# Patient Record
Sex: Male | Born: 1950 | Race: White | Hispanic: No | Marital: Married | Smoking: Never smoker
Health system: Southern US, Community
[De-identification: ages and names within clinical notes are randomized; demographics above are authoritative.]

## PROBLEM LIST (undated history)

## (undated) DIAGNOSIS — M1990 Unspecified osteoarthritis, unspecified site: Secondary | ICD-10-CM

## (undated) DIAGNOSIS — Z9289 Personal history of other medical treatment: Secondary | ICD-10-CM

## (undated) DIAGNOSIS — Z889 Allergy status to unspecified drugs, medicaments and biological substances status: Secondary | ICD-10-CM

## (undated) DIAGNOSIS — E785 Hyperlipidemia, unspecified: Secondary | ICD-10-CM

## (undated) DIAGNOSIS — M549 Dorsalgia, unspecified: Secondary | ICD-10-CM

## (undated) DIAGNOSIS — G709 Myoneural disorder, unspecified: Secondary | ICD-10-CM

## (undated) DIAGNOSIS — G2581 Restless legs syndrome: Secondary | ICD-10-CM

## (undated) DIAGNOSIS — D229 Melanocytic nevi, unspecified: Secondary | ICD-10-CM

## (undated) DIAGNOSIS — T148XXA Other injury of unspecified body region, initial encounter: Secondary | ICD-10-CM

## (undated) DIAGNOSIS — I10 Essential (primary) hypertension: Secondary | ICD-10-CM

## (undated) HISTORY — PX: FUNCTIONAL ENDOSCOPIC SINUS SURGERY: SUR616

## (undated) HISTORY — PX: BACK SURGERY: SHX140

## (undated) HISTORY — PX: COLONOSCOPY: SHX174

---
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
## 2000-08-18 ENCOUNTER — Observation Stay (HOSPITAL_COMMUNITY): Admission: AD | Admit: 2000-08-18 | Discharge: 2000-08-19 | Payer: Self-pay | Admitting: Neurosurgery

## 2000-08-18 ENCOUNTER — Encounter: Payer: Self-pay | Admitting: Neurosurgery

## 2002-10-29 ENCOUNTER — Encounter: Payer: Self-pay | Admitting: Neurosurgery

## 2002-10-29 ENCOUNTER — Ambulatory Visit (HOSPITAL_COMMUNITY): Admission: RE | Admit: 2002-10-29 | Discharge: 2002-10-30 | Payer: Self-pay | Admitting: Neurosurgery

## 2003-11-13 ENCOUNTER — Ambulatory Visit: Payer: Self-pay | Admitting: Internal Medicine

## 2006-12-12 ENCOUNTER — Ambulatory Visit: Payer: Self-pay | Admitting: Gastroenterology

## 2011-03-29 ENCOUNTER — Ambulatory Visit: Payer: Self-pay | Admitting: Family Medicine

## 2012-12-18 ENCOUNTER — Ambulatory Visit: Payer: Self-pay | Admitting: Ophthalmology

## 2013-01-13 ENCOUNTER — Ambulatory Visit: Payer: Self-pay | Admitting: Physician Assistant

## 2013-05-05 ENCOUNTER — Ambulatory Visit: Payer: Self-pay | Admitting: Unknown Physician Specialty

## 2014-04-15 ENCOUNTER — Ambulatory Visit: Payer: Self-pay | Admitting: Unknown Physician Specialty

## 2014-04-16 ENCOUNTER — Other Ambulatory Visit: Payer: Self-pay | Admitting: Unknown Physician Specialty

## 2014-04-19 LAB — EXPECTORATED SPUTUM ASSESSMENT W GRAM STAIN, RFLX TO RESP C

## 2014-05-05 ENCOUNTER — Ambulatory Visit
Admit: 2014-05-05 | Disposition: A | Discharge status: Home / Self Care | Payer: Self-pay | Attending: Unknown Physician Specialty | Admitting: Unknown Physician Specialty

## 2014-05-07 ENCOUNTER — Institutional Professional Consult (permissible substitution): Payer: Self-pay | Admitting: Internal Medicine

## 2014-07-08 ENCOUNTER — Emergency Department: Payer: BLUE CROSS/BLUE SHIELD

## 2014-07-08 ENCOUNTER — Emergency Department
Admission: EM | Admit: 2014-07-08 | Discharge: 2014-07-08 | Disposition: A | Discharge status: Home / Self Care | Payer: BLUE CROSS/BLUE SHIELD | Attending: Student | Admitting: Student

## 2014-07-08 DIAGNOSIS — Z23 Encounter for immunization: Secondary | ICD-10-CM | POA: Diagnosis not present

## 2014-07-08 DIAGNOSIS — Y998 Other external cause status: Secondary | ICD-10-CM | POA: Insufficient documentation

## 2014-07-08 DIAGNOSIS — S61012A Laceration without foreign body of left thumb without damage to nail, initial encounter: Secondary | ICD-10-CM

## 2014-07-08 DIAGNOSIS — W312XXA Contact with powered woodworking and forming machines, initial encounter: Secondary | ICD-10-CM | POA: Diagnosis not present

## 2014-07-08 DIAGNOSIS — Z79891 Long term (current) use of opiate analgesic: Secondary | ICD-10-CM | POA: Diagnosis not present

## 2014-07-08 DIAGNOSIS — Y9389 Activity, other specified: Secondary | ICD-10-CM | POA: Diagnosis not present

## 2014-07-08 DIAGNOSIS — Y9289 Other specified places as the place of occurrence of the external cause: Secondary | ICD-10-CM | POA: Diagnosis not present

## 2014-07-08 DIAGNOSIS — S61112A Laceration without foreign body of left thumb with damage to nail, initial encounter: Secondary | ICD-10-CM | POA: Insufficient documentation

## 2014-07-08 MED ORDER — LIDOCAINE HCL (PF) 1 % IJ SOLN
5.0000 mL | Freq: Once | INTRAMUSCULAR | Status: AC
  Administered 2014-07-08: 5 mL

## 2014-07-08 MED ORDER — OXYCODONE-ACETAMINOPHEN 5-325 MG PO TABS
ORAL_TABLET | ORAL | Status: AC
  Administered 2014-07-08: 1 via ORAL
  Filled 2014-07-08: qty 1

## 2014-07-08 MED ORDER — LIDOCAINE HCL (PF) 1 % IJ SOLN
INTRAMUSCULAR | Status: AC
  Administered 2014-07-08: 5 mL
  Filled 2014-07-08: qty 5

## 2014-07-08 MED ORDER — TRAMADOL HCL 50 MG PO TABS: 50.0000 mg | ORAL_TABLET | Freq: Two times a day (BID) | ORAL | Status: DC

## 2014-07-08 MED ORDER — TETANUS-DIPHTH-ACELL PERTUSSIS 5-2.5-18.5 LF-MCG/0.5 IM SUSP
INTRAMUSCULAR | Status: AC
  Administered 2014-07-08: 0.5 mL via INTRAMUSCULAR
  Filled 2014-07-08: qty 0.5

## 2014-07-08 MED ORDER — OXYCODONE-ACETAMINOPHEN 5-325 MG PO TABS
1.0000 | ORAL_TABLET | Freq: Once | ORAL | Status: AC
  Administered 2014-07-08: 1 via ORAL

## 2014-07-08 MED ORDER — TETANUS-DIPHTH-ACELL PERTUSSIS 5-2.5-18.5 LF-MCG/0.5 IM SUSP
0.5000 mL | Freq: Once | INTRAMUSCULAR | Status: AC
  Administered 2014-07-08: 0.5 mL via INTRAMUSCULAR

## 2014-07-08 NOTE — ED Notes (Signed)
Patient states he got his "left thumb caught in a table saw". Nail, nail bed involvement. Bleeding controlled.

## 2014-07-08 NOTE — ED Provider Notes (Signed)
[facility] Emergency Department Provider Note ____________________________________________  Time seen: 1335  I have reviewed the triage vital signs and the nursing notes.  HISTORY  Chief Complaint Laceration and Finger Injury  HPI Dennis Lawrence is a 64 y.o. male who reports to the ED for evaluation management of his left thumb laceration. He describes that he was working with a table saw when he accidentally got his left thumb caught in the table. He describes a laceration to the palm side of the thumb and over the distal nail. He reports a tetanus status so we'll treat 5 and 10 years prior to arrival. He rates his pain an 8 out of 10 in triage. Patient is given a Percocet in triage due to reported pain.  No past medical history on file.  There are no active problems to display for this patient.  No past surgical history on file.  Current Outpatient Rx  Name  Route  Sig  Dispense  Refill  . traMADol (ULTRAM) 50 MG tablet   Oral   Take 1 tablet (50 mg total) by mouth 2 (two) times daily.   10 tablet   0    Allergies Review of patient's allergies indicates no known allergies.  No family history on file.  Social History History  Substance Use Topics  . Smoking status: Not on file  . Smokeless tobacco: Not on file  . Alcohol Use: Not on file    Review of Systems  Constitutional: Negative for fever. Eyes: Negative for visual changes. ENT: Negative for sore throat. Cardiovascular: Negative for chest pain. Respiratory: Negative for shortness of breath. Gastrointestinal: Negative for abdominal pain, vomiting and diarrhea. Genitourinary: Negative for dysuria. Musculoskeletal: Negative for back pain. Skin: Negative for rash. Neurological: Negative for headaches, focal weakness or numbness. ____________________________________________  PHYSICAL EXAM:  VITAL SIGNS: ED Triage Vitals  Enc Vitals Group     BP 07/08/14 1212 160/83 mmHg     Pulse  Rate 07/08/14 1212 62     Resp 07/08/14 1212 18     Temp 07/08/14 1212 98.3 F (36.8 C)     Temp Source 07/08/14 1212 Oral     SpO2 07/08/14 1212 96 %     Weight 07/08/14 1212 230 lb (104.327 kg)     Height 07/08/14 1212 6\' 5"  (1.956 m)     Head Cir --      Peak Flow --      Pain Score 07/08/14 1213 8     Pain Loc --      Pain Edu? --      Excl. in [place]? --    Constitutional: Alert and oriented. Well appearing and in no distress. HEENT: Normocephalic and atraumatic. Conjunctivae are normal. PERRL. Normal extraocular movements. Cardiovascular: Normal distal pulses and capillary refill. Respiratory: Normal respiratory effort. Musculoskeletal: Left thumb with volar surface laceration distally at the distal phalanx. Distal nail with shave laceration of the distal 3rd of the nail with exposed nail bed. Normal left thumb DIP ROM and normal gross sensation. Neurologic:  Normal speech and language. No gross focal neurologic deficits are appreciated. Skin:  Skin is warm, dry and intact. No rash noted. Psychiatric: Mood and affect are normal. Patient exhibits appropriate insight and judgment. ____________________________________________   RADIOLOGY Left Thumb  IMPRESSION: Soft tissue injury without evidence of fracture, malalignment or imbedded foreign body. ____________________________________________  LACERATION REPAIR Performed by: Melvenia Needles Authorized by: Melvenia Needles Consent: Verbal consent obtained. Risks and  benefits: risks, benefits and alternatives were discussed Consent given by: patient Patient identity confirmed: provided demographic data Prepped and Draped in normal sterile fashion Wound explored  Laceration Location: Left thumb  Laceration Length: 2 cm  No Foreign Bodies seen or palpated  Anesthesia: Digital block + local infiltration  Local anesthetic: lidocaine 1% w/o epinephrine  Anesthetic total: 3 ml  Irrigation method:  syringe Amount of cleaning: standard  Skin closure: 5-0 Nylon  Number of sutures: 8  Technique: interrupted  Patient tolerance: Patient tolerated the procedure well with no immediate complications. ____________________________________________ PROCEDURES  Percocet 5/325 mg PO Tdap IM  INITIAL IMPRESSION / ASSESSMENT AND PLAN / ED COURSE  Left thumb laceration with distal nail bed laceration. Treatment with suture repair. Radiology reults to patient. Tdap booster given. Patient given Tramadol #10 for pain relief.  ____________________________________________  FINAL CLINICAL IMPRESSION(S) / ED DIAGNOSES  Final diagnoses:  Laceration of thumb, left, initial encounter     Melvenia Needles, PA-C 07/08/14 1600  Joanne Gavel, MD 07/14/14 [phone]

## 2014-07-08 NOTE — Discharge Instructions (Signed)
Laceration Care, Adult °A laceration is a cut or lesion that goes through all layers of the skin and into the tissue just beneath the skin. °TREATMENT  °Some lacerations may not require closure. Some lacerations may not be able to be closed due to an increased risk of infection. It is important to see your caregiver as soon as possible after an injury to minimize the risk of infection and maximize the opportunity for successful closure. °If closure is appropriate, pain medicines may be given, if needed. The wound will be cleaned to help prevent infection. Your caregiver will use stitches (sutures), staples, wound glue (adhesive), or skin adhesive strips to repair the laceration. These tools bring the skin edges together to allow for faster healing and a better cosmetic outcome. However, all wounds will heal with a scar. Once the wound has healed, scarring can be minimized by covering the wound with sunscreen during the day for 1 full year. °HOME CARE INSTRUCTIONS  °For sutures or staples: °· Keep the wound clean and dry. °· If you were given a bandage (dressing), you should change it at least once a day. Also, change the dressing if it becomes wet or dirty, or as directed by your caregiver. °· Wash the wound with soap and water 2 times a day. Rinse the wound off with water to remove all soap. Pat the wound dry with a clean towel. °· After cleaning, apply a thin layer of the antibiotic ointment as recommended by your caregiver. This will help prevent infection and keep the dressing from sticking. °· You may shower as usual after the first 24 hours. Do not soak the wound in water until the sutures are removed. °· Only take over-the-counter or prescription medicines for pain, discomfort, or fever as directed by your caregiver. °· Get your sutures or staples removed as directed by your caregiver. °For skin adhesive strips: °· Keep the wound clean and dry. °· Do not get the skin adhesive strips wet. You may bathe  carefully, using caution to keep the wound dry. °· If the wound gets wet, pat it dry with a clean towel. °· Skin adhesive strips will fall off on their own. You may trim the strips as the wound heals. Do not remove skin adhesive strips that are still stuck to the wound. They will fall off in time. °For wound adhesive: °· You may briefly wet your wound in the shower or bath. Do not soak or scrub the wound. Do not swim. Avoid periods of heavy perspiration until the skin adhesive has fallen off on its own. After showering or bathing, gently pat the wound dry with a clean towel. °· Do not apply liquid medicine, cream medicine, or ointment medicine to your wound while the skin adhesive is in place. This may loosen the film before your wound is healed. °· If a dressing is placed over the wound, be careful not to apply tape directly over the skin adhesive. This may cause the adhesive to be pulled off before the wound is healed. °· Avoid prolonged exposure to sunlight or tanning lamps while the skin adhesive is in place. Exposure to ultraviolet light in the first year will darken the scar. °· The skin adhesive will usually remain in place for 5 to 10 days, then naturally fall off the skin. Do not pick at the adhesive film. °You may need a tetanus shot if: °· You cannot remember when you had your last tetanus shot. °· You have never had a tetanus   shot. If you get a tetanus shot, your arm may swell, get red, and feel warm to the touch. This is common and not a problem. If you need a tetanus shot and you choose not to have one, there is a rare chance of getting tetanus. Sickness from tetanus can be serious. SEEK MEDICAL CARE IF:   You have redness, swelling, or increasing pain in the wound.  You see a red line that goes away from the wound.  You have yellowish-white fluid (pus) coming from the wound.  You have a fever.  You notice a bad smell coming from the wound or dressing.  Your wound breaks open before or  after sutures have been removed.  You notice something coming out of the wound such as wood or glass.  Your wound is on your hand or foot and you cannot move a finger or toe. SEEK IMMEDIATE MEDICAL CARE IF:   Your pain is not controlled with prescribed medicine.  You have severe swelling around the wound causing pain and numbness or a change in color in your arm, hand, leg, or foot.  Your wound splits open and starts bleeding.  You have worsening numbness, weakness, or loss of function of any joint around or beyond the wound.  You develop painful lumps near the wound or on the skin anywhere on your body. MAKE SURE YOU:   Understand these instructions.  Will watch your condition.  Will get help right away if you are not doing well or get worse. Document Released: 01/09/2005 Document Revised: 04/03/2011 Document Reviewed: 07/05/2010 [facility] Patient Information 2015 [place], [place]. This information is not intended to replace advice given to you by your health care provider. Make sure you discuss any questions you have with your health care provider.   Keep the wound clean, dry, and covered.  Wash only with soap & water.  Change the bandage daily.  Return as needed for wound check.  Return to the ED in 10-12 days for suture removal.

## 2014-09-14 DIAGNOSIS — D229 Melanocytic nevi, unspecified: Secondary | ICD-10-CM

## 2014-09-14 HISTORY — DX: Melanocytic nevi, unspecified: D22.9

## 2015-11-10 NOTE — Anesthesia Preprocedure Evaluation (Addendum)
Anesthesia Evaluation  Patient identified by MRN, date of birth, ID band Patient awake    Reviewed: Allergy & Precautions, H&P , NPO status , Patient's Chart, lab work & pertinent test results, reviewed documented beta blocker date and time   Airway Mallampati: I  TM Distance: >3 FB Neck ROM: full    Dental no notable dental hx.    Pulmonary neg pulmonary ROS,    Pulmonary exam normal breath sounds clear to auscultation       Cardiovascular Exercise Tolerance: Good hypertension,  Rhythm:regular Rate:Normal     Neuro/Psych negative neurological ROS  negative psych ROS   GI/Hepatic negative GI ROS, Neg liver ROS,   Endo/Other  negative endocrine ROS  Renal/GU negative Renal ROS  negative genitourinary   Musculoskeletal   Abdominal   Peds  Hematology negative hematology ROS (+)   Anesthesia Other Findings   Reproductive/Obstetrics negative OB ROS                            Anesthesia Physical Anesthesia Plan  ASA: II  Anesthesia Plan: General and Regional   Post-op Pain Management: GA combined w/ Regional for post-op pain   Induction:   Airway Management Planned:   Additional Equipment:   Intra-op Plan:   Post-operative Plan:   Informed Consent: I have reviewed the patients History and Physical, chart, labs and discussed the procedure including the risks, benefits and alternatives for the proposed anesthesia with the patient or authorized representative who has indicated his/her understanding and acceptance.   Dental Advisory Given  Plan Discussed with: CRNA and Anesthesiologist  Anesthesia Plan Comments:         Anesthesia Quick Evaluation

## 2015-11-11 ENCOUNTER — Ambulatory Visit: Payer: BLUE CROSS/BLUE SHIELD | Admitting: Anesthesiology

## 2015-11-11 ENCOUNTER — Ambulatory Visit
Admission: RE | Admit: 2015-11-11 | Discharge: 2015-11-11 | Disposition: A | Discharge status: Home / Self Care | Payer: BLUE CROSS/BLUE SHIELD | Source: Ambulatory Visit | Attending: Podiatry | Admitting: Podiatry

## 2015-11-11 ENCOUNTER — Encounter
Admission: RE | Disposition: A | Discharge status: Home / Self Care | Payer: Self-pay | Source: Ambulatory Visit | Attending: Podiatry

## 2015-11-11 DIAGNOSIS — S86311A Strain of muscle(s) and tendon(s) of peroneal muscle group at lower leg level, right leg, initial encounter: Secondary | ICD-10-CM | POA: Diagnosis not present

## 2015-11-11 DIAGNOSIS — X58XXXA Exposure to other specified factors, initial encounter: Secondary | ICD-10-CM | POA: Insufficient documentation

## 2015-11-11 DIAGNOSIS — M958 Other specified acquired deformities of musculoskeletal system: Secondary | ICD-10-CM | POA: Diagnosis not present

## 2015-11-11 DIAGNOSIS — I10 Essential (primary) hypertension: Secondary | ICD-10-CM | POA: Insufficient documentation

## 2015-11-11 DIAGNOSIS — M25871 Other specified joint disorders, right ankle and foot: Secondary | ICD-10-CM | POA: Insufficient documentation

## 2015-11-11 DIAGNOSIS — M25371 Other instability, right ankle: Secondary | ICD-10-CM | POA: Diagnosis present

## 2015-11-11 HISTORY — DX: Other injury of unspecified body region, initial encounter: T14.8XXA

## 2015-11-11 HISTORY — DX: Allergy status to unspecified drugs, medicaments and biological substances: Z88.9

## 2015-11-11 HISTORY — DX: Hyperlipidemia, unspecified: E78.5

## 2015-11-11 HISTORY — DX: Dorsalgia, unspecified: M54.9

## 2015-11-11 HISTORY — PX: ANKLE ARTHROSCOPY: SHX545

## 2015-11-11 HISTORY — DX: Unspecified osteoarthritis, unspecified site: M19.90

## 2015-11-11 HISTORY — DX: Restless legs syndrome: G25.81

## 2015-11-11 HISTORY — PX: ANTERIOR TALOFIBULAR LIGAMENT REPAIR: SHX6471

## 2015-11-11 HISTORY — PX: TENDON REPAIR: SHX5111

## 2015-11-11 HISTORY — DX: Myoneural disorder, unspecified: G70.9

## 2015-11-11 HISTORY — DX: Essential (primary) hypertension: I10

## 2015-11-11 HISTORY — DX: Personal history of other medical treatment: Z92.89

## 2015-11-11 SURGERY — ARTHROSCOPY, ANKLE
Anesthesia: Regional | Site: Ankle | Laterality: Right | Wound class: Clean

## 2015-11-11 MED ORDER — LACTATED RINGERS IV SOLN
INTRAVENOUS | Status: DC
  Administered 2015-11-11 (×2): via INTRAVENOUS

## 2015-11-11 MED ORDER — DEXAMETHASONE SODIUM PHOSPHATE 4 MG/ML IJ SOLN
INTRAMUSCULAR | Status: DC | PRN
  Administered 2015-11-11: 4 mg via PERINEURAL

## 2015-11-11 MED ORDER — PROPOFOL 10 MG/ML IV BOLUS
INTRAVENOUS | Status: DC | PRN
  Administered 2015-11-11: 50 mg via INTRAVENOUS
  Administered 2015-11-11: 150 mg via INTRAVENOUS

## 2015-11-11 MED ORDER — OXYCODONE HCL 5 MG PO TABS: 5.0000 mg | ORAL_TABLET | Freq: Once | ORAL | Status: DC | PRN

## 2015-11-11 MED ORDER — EPHEDRINE SULFATE 50 MG/ML IJ SOLN
INTRAMUSCULAR | Status: DC | PRN
  Administered 2015-11-11 (×4): 5 mg via INTRAVENOUS

## 2015-11-11 MED ORDER — OXYCODONE-ACETAMINOPHEN 5-325 MG PO TABS: 1.0000 | ORAL_TABLET | ORAL | 0 refills | Status: DC | PRN

## 2015-11-11 MED ORDER — ROPIVACAINE HCL 5 MG/ML IJ SOLN: INTRAMUSCULAR | Status: DC | PRN

## 2015-11-11 MED ORDER — FENTANYL CITRATE (PF) 100 MCG/2ML IJ SOLN
INTRAMUSCULAR | Status: DC | PRN
  Administered 2015-11-11: 50 ug via INTRAVENOUS

## 2015-11-11 MED ORDER — ACETAMINOPHEN 160 MG/5ML PO SOLN: 325.0000 mg | ORAL | Status: DC | PRN

## 2015-11-11 MED ORDER — MIDAZOLAM HCL 2 MG/2ML IJ SOLN
INTRAMUSCULAR | Status: DC | PRN
  Administered 2015-11-11 (×3): 1 mg via INTRAVENOUS

## 2015-11-11 MED ORDER — BUPIVACAINE HCL (PF) 0.25 % IJ SOLN
INTRAMUSCULAR | Status: DC | PRN
  Administered 2015-11-11: 15 mL

## 2015-11-11 MED ORDER — ONDANSETRON HCL 4 MG/2ML IJ SOLN
INTRAMUSCULAR | Status: DC | PRN
  Administered 2015-11-11: 4 mg via INTRAVENOUS

## 2015-11-11 MED ORDER — DEXTROSE 5 % IV SOLN
2000.0000 mg | Freq: Once | INTRAVENOUS | Status: AC
  Administered 2015-11-11: 2000 mg via INTRAVENOUS

## 2015-11-11 MED ORDER — ONDANSETRON HCL 4 MG/2ML IJ SOLN: 4.0000 mg | Freq: Once | INTRAMUSCULAR | Status: DC | PRN

## 2015-11-11 MED ORDER — OXYCODONE HCL 5 MG/5ML PO SOLN: 5.0000 mg | Freq: Once | ORAL | Status: DC | PRN

## 2015-11-11 MED ORDER — LIDOCAINE HCL (CARDIAC) 20 MG/ML IV SOLN
INTRAVENOUS | Status: DC | PRN
  Administered 2015-11-11: 50 mg via INTRATRACHEAL

## 2015-11-11 MED ORDER — ROPIVACAINE HCL 5 MG/ML IJ SOLN
INTRAMUSCULAR | Status: DC | PRN
  Administered 2015-11-11: 40 mL via PERINEURAL

## 2015-11-11 MED ORDER — HYDROMORPHONE HCL 1 MG/ML IJ SOLN: 0.2500 mg | INTRAMUSCULAR | Status: DC | PRN

## 2015-11-11 MED ORDER — ACETAMINOPHEN 325 MG PO TABS: 325.0000 mg | ORAL_TABLET | ORAL | Status: DC | PRN

## 2015-11-11 SURGICAL SUPPLY — 72 items
ARTHROWAND PARAGON T2 (SURGICAL WAND) ×2
BANDAGE ELASTIC 3 CLIP NS LF (GAUZE/BANDAGES/DRESSINGS) IMPLANT
BANDAGE ELASTIC 4 VELCRO NS (GAUZE/BANDAGES/DRESSINGS) ×2 IMPLANT
BENZOIN TINCTURE PRP APPL 2/3 (GAUZE/BANDAGES/DRESSINGS) ×2 IMPLANT
BLADE MED AGGRESSIVE (BLADE) IMPLANT
BLADE OSC/SAGITTAL MD 5.5X18 (BLADE) IMPLANT
BLADE SURG 15 STRL LF DISP TIS (BLADE) IMPLANT
BLADE SURG 15 STRL SS (BLADE)
BNDG COHESIVE 4X5 TAN STRL (GAUZE/BANDAGES/DRESSINGS) ×2 IMPLANT
BNDG ESMARK 4X12 TAN STRL LF (GAUZE/BANDAGES/DRESSINGS) ×2 IMPLANT
BNDG GAUZE 4.5X4.1 6PLY STRL (MISCELLANEOUS) ×2 IMPLANT
BNDG STRETCH 4X75 STRL LF (GAUZE/BANDAGES/DRESSINGS) ×2 IMPLANT
BUR AGGRESSIVE+ 2.5 (BURR) ×2 IMPLANT
CANISTER SUCT 1200ML W/VALVE (MISCELLANEOUS) IMPLANT
COVER LIGHT HANDLE UNIVERSAL (MISCELLANEOUS) ×4 IMPLANT
CUFF TOURN SGL QUICK 18 (TOURNIQUET CUFF) IMPLANT
CUFF TOURN SGL QUICK 24 (TOURNIQUET CUFF)
CUFF TOURN SGL QUICK 30 (MISCELLANEOUS)
CUFF TOURN SGL QUICK 34 (TOURNIQUET CUFF)
CUFF TRNQT CYL 24X4X40X1 (TOURNIQUET CUFF) IMPLANT
CUFF TRNQT CYL 34X4X40X1 (TOURNIQUET CUFF) IMPLANT
CUFF TRNQT CYL LO 30X4X (MISCELLANEOUS) IMPLANT
DRAPE FLUOR MINI C-ARM 54X84 (DRAPES) IMPLANT
DURAPREP 26ML APPLICATOR (WOUND CARE) ×2 IMPLANT
ETHIBOND 2 0 GREEN CT 2 30IN (SUTURE) ×2 IMPLANT
GAUZE PETRO XEROFOAM 1X8 (MISCELLANEOUS) ×2 IMPLANT
GAUZE SPONGE 4X4 12PLY STRL (GAUZE/BANDAGES/DRESSINGS) ×2 IMPLANT
GLOVE BIO SURGEON STRL SZ7.5 (GLOVE) ×4 IMPLANT
GLOVE INDICATOR 8.0 STRL GRN (GLOVE) ×6 IMPLANT
GOWN STRL REUS W/ TWL LRG LVL3 (GOWN DISPOSABLE) ×2 IMPLANT
GOWN STRL REUS W/TWL LRG LVL3 (GOWN DISPOSABLE) ×2
IV LACTATED RINGER IRRG 3000ML (IV SOLUTION) ×3
IV LR IRRIG 3000ML ARTHROMATIC (IV SOLUTION) ×3 IMPLANT
K-WIRE DBL END TROCAR 6X.045 (WIRE)
K-WIRE DBL END TROCAR 6X.062 (WIRE)
KIT ROOM TURNOVER OR (KITS) ×2 IMPLANT
KWIRE DBL END TROCAR 6X.045 (WIRE) IMPLANT
KWIRE DBL END TROCAR 6X.062 (WIRE) IMPLANT
MANIFOLD 4PT FOR NEPTUNE1 (MISCELLANEOUS) ×2 IMPLANT
NDL SAFETY 18GX1.5 (NEEDLE) IMPLANT
NEEDLE HYPO 25GX1X1/2 BEV (NEEDLE) IMPLANT
NS IRRIG 500ML POUR BTL (IV SOLUTION) IMPLANT
PACK EXTREMITY ARMC (MISCELLANEOUS) ×2 IMPLANT
PAD GROUND ADULT SPLIT (MISCELLANEOUS) ×2 IMPLANT
PIN BALLS 3/8 F/.045 WIRE (MISCELLANEOUS) IMPLANT
RASP SM TEAR CROSS CUT (RASP) IMPLANT
STAPLER SKIN PROX 35W (STAPLE) ×2 IMPLANT
STOCKINETTE STRL 6IN 960660 (GAUZE/BANDAGES/DRESSINGS) ×2 IMPLANT
STRAP ANKLE DISTRACTOR (MISCELLANEOUS) IMPLANT
STRAP ANKLE FOOT DISTRACTOR (ORTHOPEDIC SUPPLIES) IMPLANT
STRAP BODY AND KNEE 60X3 (MISCELLANEOUS) ×2 IMPLANT
STRIP CLOSURE SKIN 1/4X4 (GAUZE/BANDAGES/DRESSINGS) ×2 IMPLANT
SUT ETHIBOND GREEN BRAID 0S 4 (SUTURE) IMPLANT
SUT ETHILON 3-0 FS-10 30 BLK (SUTURE) ×2
SUT ETHILON 4-0 (SUTURE)
SUT ETHILON 4-0 FS2 18XMFL BLK (SUTURE)
SUT MNCRL 5-0+ PC-1 (SUTURE) IMPLANT
SUT MONOCRYL 5-0 (SUTURE)
SUT PDS 3 0 CT 1 27 (SUTURE) ×2 IMPLANT
SUT VIC AB 2-0 SH 27 (SUTURE) ×1
SUT VIC AB 2-0 SH 27XBRD (SUTURE) ×1 IMPLANT
SUT VIC AB 3-0 SH 27 (SUTURE)
SUT VIC AB 3-0 SH 27X BRD (SUTURE) IMPLANT
SUT VIC AB 4-0 FS2 27 (SUTURE) ×2 IMPLANT
SUT VIC AB 4-0 SH 27 (SUTURE)
SUT VIC AB 4-0 SH 27XANBCTRL (SUTURE) IMPLANT
SUTURE EHLN 3-0 FS-10 30 BLK (SUTURE) ×1 IMPLANT
SUTURE ETHLN 4-0 FS2 18XMF BLK (SUTURE) IMPLANT
TUBING ARTHRO INFLOW-ONLY STRL (TUBING) ×2 IMPLANT
WAND ARTHRO PARAGON T2 (SURGICAL WAND) ×1 IMPLANT
WAND COVAC 50 IFS (MISCELLANEOUS) ×2 IMPLANT
WAND TOPAZ MICRO DEBRIDER (MISCELLANEOUS) ×2 IMPLANT

## 2015-11-11 NOTE — Anesthesia Postprocedure Evaluation (Signed)
Anesthesia Post Note  Patient: Dennis Lawrence  Procedure(s) Performed: Procedure(s) (LRB): ANKLE ARTHROSCOPY/OCD REPAIR (Right) FLEXOR TENDON REPAIR-SECOND, TENOLYSIS-SINGLE (Right) ANKLE BROSTROM-GOULD PROCEDURE (Right)  Patient location during evaluation: PACU Anesthesia Type: General Level of consciousness: awake and alert Pain management: pain level controlled Vital Signs Assessment: post-procedure vital signs reviewed and stable Respiratory status: spontaneous breathing, nonlabored ventilation, respiratory function stable and patient connected to nasal cannula oxygen Cardiovascular status: blood pressure returned to baseline and stable Postop Assessment: no signs of nausea or vomiting Anesthetic complications: no    Trecia Rogers

## 2015-11-11 NOTE — Transfer of Care (Signed)
Immediate Anesthesia Transfer of Care Note  Patient: Dennis Lawrence  Procedure(s) Performed: Procedure(s) with comments: ANKLE ARTHROSCOPY/OCD REPAIR (Right) - POPLITEAL FLEXOR TENDON REPAIR-SECOND, TENOLYSIS-SINGLE (Right) ANKLE BROSTROM-GOULD PROCEDURE (Right)  Patient Location: PACU  Anesthesia Type: General, Regional  Level of Consciousness: awake, alert  and patient cooperative  Airway and Oxygen Therapy: Patient Spontanous Breathing and Patient connected to supplemental oxygen  Post-op Assessment: Post-op Vital signs reviewed, Patient's Cardiovascular Status Stable, Respiratory Function Stable, Patent Airway and No signs of Nausea or vomiting  Post-op Vital Signs: Reviewed and stable  Complications: No apparent anesthesia complications

## 2015-11-11 NOTE — Anesthesia Procedure Notes (Signed)
Anesthesia Regional Block:  Popliteal block  Pre-Anesthetic Checklist: ,, timeout performed, Correct Patient, Correct Site, Correct Laterality, Correct Procedure, Correct Position, site marked, Risks and benefits discussed,  Surgical consent,  Pre-op evaluation,  At surgeon's request and post-op pain management   Prep: chloraprep       Needles:  Injection technique: Single-shot  Needle Type: Echogenic Needle     Needle Length: 9cm 9 cm Needle Gauge: 21 and 21 G    Additional Needles:  Procedures: ultrasound guided (picture in chart) Popliteal block Narrative:  Start time: 11/11/2015 7:08 AM End time: 11/11/2015 7:15 AM Injection made incrementally with aspirations every 5 mL.  Performed by: Personally   Additional Notes: Functioning IV was confirmed and monitors applied. Ultrasound guidance: relevant anatomy identified, needle position confirmed, local anesthetic spread visualized around nerve(s)., vascular puncture avoided.  Image printed for medical record.  Negative aspiration and no paresthesias; incremental administration of local anesthetic. The patient tolerated the procedure well. Vitals signes recorded in RN notes.

## 2015-11-11 NOTE — H&P (Signed)
  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  The various methods of treatment have been discussed with the patient.  No guarantees were given.  After consideration of risks, benefits and other options for treatment, the patient has consented to surgery.  I have reviewed the patients' chart and labs.    Patient Vitals for the past 24 hrs:  BP Temp Temp src Pulse Resp SpO2 Height Weight  11/11/15 0654 117/69 97.1 F (36.2 C) Temporal (!) 55 16 97 % 6\' 5"  (1.956 m) 107.5 kg (237 lb)    A history and physical examination was performed in my office.  The patient was reexamined.  There have been no changes to this history and physical examination.  Samara Deist A

## 2015-11-11 NOTE — Op Note (Signed)
Operative note   Surgeon:Addelyn Alleman Lawyer: None    Preop diagnosis: 1. Peroneal brevis tendon tear  2. Lateral ankle instability  3. Osteochondral lesion right ankle    Postop diagnosis: Same    Procedure: 1. Repair peroneal brevis tendon 2. Brostrm right lateral ankle stabilization  3. Arthroscopic assisted osteochondral lesion repair right talus    EBL: Minimal    Anesthesia:regional and general    Hemostasis: Thigh tourniquet inflated to 250 mmHg for approximately 90 minutes    Specimen: None    Complications: None    Operative indications:Dennis Lawrence is an 65 y.o. that presents today for surgical intervention.  The risks/benefits/alternatives/complications have been discussed and consent has been given.    Procedure:  Patient was brought into the OR and placed on the operating table in thesupine position. After anesthesia was obtained theright lower extremity was prepped and draped in usual sterile fashion.  Attention was directed to the antrum aspect of the ankle. The foot was suspended. An anteromedial and anterolateral ankle joint incision portals were made for the ankle arthroscopy equipment. The arthroscopy camera was entered in the anteromedial portal. There was a fair amount of synovitis on the anterior aspect of the ankle joint. A small shaver was introduced and the anterolateral portal and all of the synovitis was debrided extensively. Further evaluation of the ankle joint revealed a small osteochondral lesion on the lateral shoulder of the talus. With a combination of curettes, and Paragon wand to repair and debris the osteochondral lesion. 2 small microfracture holes were placed into the lesion. The incisions were then closed with 3-0 nylon.  Attention was directed to the lateral aspect of the ankle where a longitudinal incision was made overlying the peroneal tendons. Sharp and blunt dissection carried down to the peroneal sheath. The peroneal sheath was  entered and the sheath was opened from approximately 10 cm above the fibula to the level just proximal to the fifth metatarsal base region. At this time the peroneal longus and brevis tendons were evaluated. The peroneal longus tendon was intact without signs of tearing. There was a split opening tearing of the peroneal brevis. Basically was forming a flattened peroneal brevis tendon. There was also noted to be a low-lying peroneal brevis muscle belly. The low-lying muscle belly was debrided away and sent for pathological examination. The Chronic brevis tendon tear was debrided with a curet. Tubularization with a 3-0 Vicryl was performed from distal to proximal. Prior to final repair the peroneal brevis tendon was infiltrated with the Topaz wand. The peroneal retinaculum was repaired with a 2-0 PDS. The proximal and distal aspects of the peroneal sheath were repaired with a 3-0 Vicryl. Subcutaneous tissue was repaired with a 4-0 Vicryl skin was closed with skin staples.  The anterolateral aspect of the ankle was then marked and a small curved incision was made overlying the anterior talofibular ligament. Sharp and blunt dissection carried down to the extensor retinaculum. This was incised. There was noted to be attenuation of the anterior talofibular ligament. This was incised and a pants over vest suture was placed with a 2-0 Tycron suture.  The extensor retinaculum was brought proximal and sutured with a 2-0 Vicryl. The subcutaneous tissue was sutured with a 3-0 Vicryl and the skin with a 3-0 nylon.  All areas were infiltrated with 0.025% Marcaine plain. A bulky sterile dressing was applied and the patient was placed in an Naval architect.     Patient tolerated the  procedure and anesthesia well.  Was transported from the OR to the PACU with all vital signs stable and vascular status intact. To be discharged per routine protocol.  Will follow up in approximately 1 week in the outpatient clinic.

## 2015-11-11 NOTE — Anesthesia Procedure Notes (Signed)
Procedure Name: LMA Insertion Date/Time: 11/11/2015 7:38 AM Performed by: Cameron Ali Pre-anesthesia Checklist: Patient identified, Emergency Drugs available, Suction available, Timeout performed and Patient being monitored Patient Re-evaluated:Patient Re-evaluated prior to inductionOxygen Delivery Method: Circle system utilized Preoxygenation: Pre-oxygenation with 100% oxygen Intubation Type: IV induction LMA: LMA inserted LMA Size: 5.0 Number of attempts: 1 Placement Confirmation: positive ETCO2 and breath sounds checked- equal and bilateral Tube secured with: Tape

## 2015-11-11 NOTE — Progress Notes (Signed)
Assisted Rochel Brome ANMD with right, popliteal block. Side rails up, monitors on throughout procedure. See vital signs in flow sheet. Tolerated Procedure well.

## 2015-11-11 NOTE — Discharge Instructions (Signed)
[place] [address] [place]   1. Take your medication as prescribed.  Pain medication should be taken only as needed.  2. Keep the dressing clean, dry and intact.  3. Keep your foot elevated above the heart level for the first 48 hours.  4. Walking to the bathroom and brief periods of walking are acceptable, unless we have instructed you to be non-weight bearing.  5. Always wear your post-op shoe when walking.  Always use your crutches if you are to be non-weight bearing.  6. Every hour you are awake:  - Bend your knee 15 times. - Massage calf 15 times  7. Call [facility] [phone]) if any of the following problems occur: - You develop a temperature or fever. - The bandage becomes saturated with blood. - Medication does not stop your pain. - Injury of the foot occurs. - Any symptoms of infection including redness, odor, or red streaks running from wound.  General Anesthesia, Adult, Care After Refer to this sheet in the next few weeks. These instructions provide you with information on caring for yourself after your procedure. Your health care provider may also give you more specific instructions. Your treatment has been planned according to current medical practices, but problems sometimes occur. Call your health care provider if you have any problems or questions after your procedure. WHAT TO EXPECT AFTER THE PROCEDURE After the procedure, it is typical to experience:  Sleepiness.  Nausea and vomiting. HOME CARE INSTRUCTIONS  For the first 24 hours after general anesthesia:  Have a responsible person with you.  Do not drive a car. If you are alone, do not take public transportation.  Do not drink alcohol.  Do not take medicine that has not been prescribed by your health care provider.  Do not sign important papers or make important  decisions.  You may resume a normal diet and activities as directed by your health care provider.  Change bandages (dressings) as directed.  If you have questions or problems that seem related to general anesthesia, call the hospital and ask for the anesthetist or anesthesiologist on call. SEEK MEDICAL CARE IF:  You have nausea and vomiting that continue the day after anesthesia.  You develop a rash. SEEK IMMEDIATE MEDICAL CARE IF:   You have difficulty breathing.  You have chest pain.  You have any allergic problems.   This information is not intended to replace advice given to you by your health care provider. Make sure you discuss any questions you have with your health care provider.   Document Released: 04/17/2000 Document Revised: 01/30/2014 Document Reviewed: 05/10/2011 Elsevier Interactive Patient Education [employer].

## 2015-11-12 ENCOUNTER — Encounter: Payer: Self-pay | Admitting: Podiatry

## 2015-11-15 LAB — SURGICAL PATHOLOGY

## 2015-12-13 ENCOUNTER — Ambulatory Visit: Payer: BLUE CROSS/BLUE SHIELD | Attending: Podiatry | Admitting: Physical Therapy

## 2015-12-13 ENCOUNTER — Encounter: Payer: Self-pay | Admitting: Physical Therapy

## 2015-12-13 DIAGNOSIS — R262 Difficulty in walking, not elsewhere classified: Secondary | ICD-10-CM | POA: Diagnosis present

## 2015-12-13 DIAGNOSIS — M6281 Muscle weakness (generalized): Secondary | ICD-10-CM | POA: Insufficient documentation

## 2015-12-13 NOTE — Therapy (Signed)
[place] MAIN [facility] SERVICES [address] [place], [place], [address] Phone: [phone]   Fax:  [phone]  Physical Therapy Evaluation  Patient Details  Name: Dennis Lawrence MRN: [id number] Date of Birth: 08-14-1950 Referring Provider: Samara Deist  Encounter Date: 12/13/2015      PT End of Session - 12/13/15 1357    Visit Number 1   Number of Visits 17   Date for PT Re-Evaluation 01/31/16   Authorization Type blue cross blue shield   PT Start Time 0145   PT Stop Time 0245   PT Time Calculation (min) 60 min   Equipment Utilized During Treatment Gait belt   Activity Tolerance Patient tolerated treatment well;Patient limited by fatigue      Past Medical History:  Diagnosis Date  . Arthritis    HANDS  . Back pain    CHRONIC, DEGENERATION OF LUMBAR OR LUMBOSACRAL INTERVERTEBRAL DISC  . H/O seasonal allergies   . History of positive PPD    NORMAL CHEST XRAY 09/1998  . Hyperlipidemia   . Hypertension   . Neuromuscular disorder (HCC)    NEURALGIA AND NEURITIS  . Restless leg syndrome   . 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 ANKLE ARTHROSCOPY Right 11/11/2015   Procedure: ANKLE ARTHROSCOPY/OCD REPAIR;  Surgeon: Samara Deist, DPM;  Location: [place];  Service: Podiatry;  Laterality: Right;  POPLITEAL  . ANTERIOR TALOFIBULAR LIGAMENT REPAIR Right 11/11/2015   Procedure: ANKLE BROSTROM-GOULD PROCEDURE;  Surgeon: Samara Deist, DPM;  Location: [place];  Service: Podiatry;  Laterality: Right;  . BACK SURGERY     L5-S1 DISCECTOMY, NUMBNESS LEFT BIG TOE  . COLONOSCOPY    . FUNCTIONAL ENDOSCOPIC SINUS SURGERY    . TENDON REPAIR Right 11/11/2015   Procedure: FLEXOR TENDON REPAIR-SECOND, TENOLYSIS-SINGLE;  Surgeon: Samara Deist, DPM;  Location: [place];  Service: Podiatry;  Laterality: Right;     There were no vitals filed for this visit.       Subjective Assessment - 12/13/15 1351    Subjective Patient reports that he is able to ambulate WBAT but that the MD said he should continue to use a AD if it hurts. Following physical therapy evaluation MD was contacted and WB order was clarified and is 25% WB to be advanced to WBAT.    Pertinent History Patient fell of a ladder 5 months ago and fractured his foot. His surgery was 11/03/15.    Limitations Standing;Walking   How long can you sit comfortably? siiting is not a problem   How long can you stand comfortably? 15 minutes   How long can you walk comfortably? 1-2 minutes   Patient Stated Goals to be able to ambulate without AD and go back to work.   Currently in Pain? Yes   Pain Score 5    Pain Location Ankle   Pain Orientation Right   Pain Descriptors / Indicators Shooting   Pain Type Chronic pain   Pain Onset More than a month ago   Pain Frequency Intermittent   Multiple Pain Sites No      PAIN: 0/10 to 5 / 10 to right ankle  POSTURE: WNL   PROM/AROM: right ankle DF is 0 deg PF is 20 deg  Inversion / eversion 5 degs  STRENGTH:  Graded on a 0-5 scale Muscle Group Left Right  Hip Flex 5/5 5/5  Hip Abd 5/5 5/5  Hip Add 5/5 5/5  Hip Ext    Hip IR/ER    Knee Flex 5/5 5/5  Knee Ext 5/5 5/5  Ankle DF 5/5 2/5  Ankle PF 5/5 2/5  Ankle inversion/eversion 1/5  SENSATION: numbness is constant and severity is intermittnet  SPECIAL TESTS:Right ankle figure 8 57 cm    FUNCTIONAL MOBILITY:independent   BALANCE: poor due to inability to stand on 1 leg or tandem stand   GAIT: Patient ambulates with R foot Boot and ace bandage with rollator, 25% WB and increase to WBAT per clarification with MD via phone call  OUTCOME MEASURES: TEST Outcome Interpretation  5 times sit<>stand 17.69sec >1 yo, >15 sec indicates increased risk for falls  10 meter walk test        . 60         m/s <1.0  m/s indicates increased risk for falls; limited community ambulator  Timed up and Go  13.92               sec <14 sec indicates increased risk for falls  6 minute walk test 900               Feet 1000 feet is community ambulator             Therapeutic exercise; Instructed in right ankle PF with YTB 20 for HEP     [facility] PT Assessment - 12/13/15 0001      Assessment   Medical Diagnosis ankle ORIF, 1. Repair peroneal brevis tendon 2. Brostrm right lateral ankle stabilization  3. Arthroscopic assisted osteochondral lesion repair right talus   Referring Provider FOWLER, Larkin Ina   Onset Date/Surgical Date 11/03/15   Hand Dominance Right   Next MD Visit 01/25/16   Prior Therapy --  no     Precautions   Precautions Fall     Restrictions   Weight Bearing Restrictions Yes   Other Position/Activity Restrictions WBAT RLE     Balance Screen   Has the patient fallen in the past 6 months Yes   Has the patient had a decrease in activity level because of a fear of falling?  Yes   Is the patient reluctant to leave their home because of a fear of falling?  No     Home Environment   Living Environment Private residence   Living Arrangements Spouse/significant other   Available Help at Discharge Family   Type of [place] Access Level entry   [place] One level   [place] - 4 wheels     Prior Function   Level of Independence Independent with basic ADLs;Independent;Requires assistive device for independence   Vocation Full time employment   Vocation Requirements property management                            PT Education - 12/13/15 1356    Education provided Yes   Education Details plan of care   Person(s) Educated Patient   Methods Explanation   Comprehension Verbalized understanding             PT Long Term Goals - 12/13/15 1459      PT LONG TERM GOAL #1   Title Patient will reduce timed up and go to <11 seconds to reduce fall  risk and demonstrate improved transfer/gait ability.   Time 8   Period Weeks  Status New     PT LONG TERM GOAL #2   Title Patient will increase 10 meter walk test to >1.76m/s as to improve gait speed for better community ambulation and to reduce fall risk   Time 8   Period Weeks   Status New     PT LONG TERM GOAL #3   Title Patient will increase R ankle  gross strength to 4+/5 as to improve functional strength for independent gait, increased standing tolerance and increased ADL ability.   Baseline 1/5   Time 8   Period Weeks   Status New     PT LONG TERM GOAL #4   Title Patient will report a worst pain of 3/10 on VAS in right ankle to improve tolerance with ADLs and reduced symptoms with activities.    Baseline 5/10   Time 8   Period Weeks   Status New     PT LONG TERM GOAL #5   Title Patient will increase six minute walk test distance to >1000 for progression to community ambulator and improve gait ability   Time 8   Period Weeks   Status New     Additional Long Term Goals   Additional Long Term Goals Yes               Plan - 12/13/15 1358    Clinical Impression Statement Patient is 65 yr old male who fell off a ladder 5 months ago and is s/p R ORIF R ankle, repair peroneal brevis tendon, brostrm right lateral ankle stabilization and arthroscopic assisted osteochondral lesion repair right talus1 month ago. He is ambulating with rollator and has decreased strength and balance that is limiting him in his ADL.'s.   Rehab Potential Good   Clinical Impairments Affecting Rehab Potential chronic ankle injury   PT Frequency 2x / week   PT Duration 8 weeks   PT Treatment/Interventions Cryotherapy;Moist Heat;Ultrasound;Therapeutic activities;Gait training;Therapeutic exercise;Balance training;Neuromuscular re-education;Passive range of motion   PT Next Visit Plan YTB DF, BAPS board DF/PF    PT Home Exercise Plan YTB PF    Consulted and Agree with Plan of Care Patient       Patient will benefit from skilled therapeutic intervention in order to improve the following deficits and impairments:  Abnormal gait, Decreased balance, Decreased endurance, Decreased mobility, Difficulty walking, Decreased activity tolerance, Impaired flexibility, Pain, Decreased strength, Impaired sensation  Visit Diagnosis: Difficulty in walking, not elsewhere classified  Muscle weakness (generalized)     Problem List There are no active problems to display for this patient. Alanson Puls, PT, DPT [place], [place] S 12/13/2015, 3:17 PM  [place] MAIN [facility] SERVICES [address] [place], [place], [address] Phone: [phone]   Fax:  [phone]  Name: Dennis Lawrence MRN: [id number] Date of Birth: 04-08-1950

## 2015-12-15 ENCOUNTER — Ambulatory Visit: Payer: BLUE CROSS/BLUE SHIELD | Admitting: Physical Therapy

## 2015-12-20 ENCOUNTER — Ambulatory Visit: Payer: BLUE CROSS/BLUE SHIELD

## 2015-12-20 DIAGNOSIS — R262 Difficulty in walking, not elsewhere classified: Secondary | ICD-10-CM

## 2015-12-20 DIAGNOSIS — M6281 Muscle weakness (generalized): Secondary | ICD-10-CM

## 2015-12-20 NOTE — Therapy (Signed)
[place] MAIN [facility] SERVICES [address] [place], [place], [address] Phone: [phone]   Fax:  [phone]  Physical Therapy Treatment  Patient Details  Name: Dennis Lawrence MRN: [id number] Date of Birth: [birth year] Referring Provider: Samara Deist  Encounter Date: 12/20/2015      PT End of Session - 12/20/15 1741    Visit Number 2   Number of Visits 17   Date for PT Re-Evaluation 01/31/16   Authorization Type blue cross blue shield   PT Start Time 1645   PT Stop Time 1730   PT Time Calculation (min) 45 min   Equipment Utilized During Treatment Gait belt   Activity Tolerance Patient tolerated treatment well;Patient limited by fatigue      Past Medical History:  Diagnosis Date  . Arthritis    HANDS  . Back pain    CHRONIC, DEGENERATION OF LUMBAR OR LUMBOSACRAL INTERVERTEBRAL DISC  . H/O seasonal allergies   . History of positive PPD    NORMAL CHEST XRAY 09/1998  . Hyperlipidemia   . Hypertension   . Neuromuscular disorder (HCC)    NEURALGIA AND NEURITIS  . Restless leg syndrome   . 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 ANKLE ARTHROSCOPY Right 11/11/2015   Procedure: ANKLE ARTHROSCOPY/OCD REPAIR;  Surgeon: Samara Deist, DPM;  Location: [place];  Service: Podiatry;  Laterality: Right;  POPLITEAL  . ANTERIOR TALOFIBULAR LIGAMENT REPAIR Right 11/11/2015   Procedure: ANKLE BROSTROM-GOULD PROCEDURE;  Surgeon: Samara Deist, DPM;  Location: [place];  Service: Podiatry;  Laterality: Right;  . BACK SURGERY     L5-S1 DISCECTOMY, NUMBNESS LEFT BIG TOE  . COLONOSCOPY    . FUNCTIONAL ENDOSCOPIC SINUS SURGERY    . TENDON REPAIR Right 11/11/2015   Procedure: FLEXOR TENDON REPAIR-SECOND, TENOLYSIS-SINGLE;  Surgeon: Samara Deist, DPM;  Location: [place];  Service: Podiatry;  Laterality: Right;     There were no vitals filed for this visit.      Subjective Assessment - 12/20/15 1649    Subjective Patient reports increased ankle pain when weight bearing on the R foot. States he feels like its getting better.    Pertinent History Patient fell of a ladder 5 months ago and fractured his foot. His surgery was 11/03/15.    Limitations Standing;Walking   How long can you sit comfortably? siiting is not a problem   How long can you stand comfortably? 15 minutes   How long can you walk comfortably? 1-2 minutes   Patient Stated Goals to be able to ambulate without AD and go back to work.   Currently in Pain? No/denies      TREATMENT Manual Therapy Performed STM to the dorsum and plantar aspect of the foot to alleviate inflammation and swelling in the foot/ankle. Grade I-II mobs to the TC and ST joint to alleviate foot/ankle pain. Grade II mobs to metatarsal digits I-V to decrease pain and spasms.  Therapeutic Exercise Towel Scrunches - x 2 min  Resisted plantarflexion, dorsiflexion, inversion and eversion - x 20 with YTB Alphabet with ankle motions - x 1 throughout ankle BAPS board - x 20 inversion/eversion with guided movement Weight shifting in standing left/right - x 10 Dorsiflexion/plantarflexion and eversion/inversion - x 20 without resistance       PT Education - 12/20/15 1740    Education provided Yes  Education Details HEP: YTB Inversion/eversion, dorsiflexion/plantarflexion, towel scrunches, alphabet   Person(s) Educated Patient   Methods Demonstration;Explanation   Comprehension Verbalized understanding;Returned demonstration             PT Long Term Goals - 12/13/15 1459      PT LONG TERM GOAL #1   Title Patient will reduce timed up and go to <11 seconds to reduce fall risk and demonstrate improved transfer/gait ability.   Time 8   Period Weeks   Status New     PT LONG TERM GOAL #2   Title Patient will increase 10 meter walk test to >1.22m/s as to  improve gait speed for better community ambulation and to reduce fall risk   Time 8   Period Weeks   Status New     PT LONG TERM GOAL #3   Title Patient will increase R ankle  gross strength to 4+/5 as to improve functional strength for independent gait, increased standing tolerance and increased ADL ability.   Baseline 1/5   Time 8   Period Weeks   Status New     PT LONG TERM GOAL #4   Title Patient will report a worst pain of 3/10 on VAS in right ankle to improve tolerance with ADLs and reduced symptoms with activities.    Baseline 5/10   Time 8   Period Weeks   Status New     PT LONG TERM GOAL #5   Title Patient will increase six minute walk test distance to >1000 for progression to community ambulator and improve gait ability   Time 8   Period Weeks   Status New     Additional Long Term Goals   Additional Long Term Goals Yes               Plan - 12/20/15 1747    Clinical Impression Statement Introduced Inversion/eversion and dorsiflexion/plantarflexion against minimal resistance (YTB) and patient tolerated with minimal discomfort at end of rep range indicating decreased ankle stabilization and coordination. Patient will benefit from further skilled therapy focused on improving ankle stabilization and weight acceptance to return to prior level of function.   Rehab Potential Good   Clinical Impairments Affecting Rehab Potential chronic ankle injury   PT Frequency 2x / week   PT Duration 8 weeks   PT Treatment/Interventions Cryotherapy;Moist Heat;Ultrasound;Therapeutic activities;Gait training;Therapeutic exercise;Balance training;Neuromuscular re-education;Passive range of motion   PT Next Visit Plan YTB DF, BAPS board DF/PF    PT Home Exercise Plan YTB PF    Consulted and Agree with Plan of Care Patient      Patient will benefit from skilled therapeutic intervention in order to improve the following deficits and impairments:  Abnormal gait, Decreased balance,  Decreased endurance, Decreased mobility, Difficulty walking, Decreased activity tolerance, Impaired flexibility, Pain, Decreased strength, Impaired sensation  Visit Diagnosis: Difficulty in walking, not elsewhere classified  Muscle weakness (generalized)     Problem List There are no active problems to display for this patient.   Blythe Stanford, PT DPT 12/20/2015, 5:55 PM  [place] MAIN [facility] SERVICES [address] [place], [place], [address] Phone: [phone]   Fax:  [phone]  Name: DAMARQUIS RUMLEY MRN: [id number] Date of Birth: [birth year]

## 2015-12-22 ENCOUNTER — Encounter: Payer: BLUE CROSS/BLUE SHIELD | Admitting: Physical Therapy

## 2015-12-23 ENCOUNTER — Ambulatory Visit: Payer: BLUE CROSS/BLUE SHIELD

## 2015-12-23 DIAGNOSIS — R262 Difficulty in walking, not elsewhere classified: Secondary | ICD-10-CM

## 2015-12-23 DIAGNOSIS — M6281 Muscle weakness (generalized): Secondary | ICD-10-CM

## 2015-12-23 NOTE — Therapy (Signed)
[place] MAIN [facility] SERVICES [address] [place], [place], [address] Phone: [phone]   Fax:  [phone]  Physical Therapy Treatment  Patient Details  Name: Dennis Lawrence MRN: [id number] Date of Birth: 11/24/1950 Referring Provider: Samara Deist  Encounter Date: 12/23/2015      PT End of Session - 12/23/15 0900    Visit Number 3   Number of Visits 17   Date for PT Re-Evaluation 01/31/16   Authorization Type blue cross blue shield   PT Start Time 0800   PT Stop Time 0845   PT Time Calculation (min) 45 min   Equipment Utilized During Treatment Gait belt   Activity Tolerance Patient tolerated treatment well;Patient limited by fatigue      Past Medical History:  Diagnosis Date  . Arthritis    HANDS  . Back pain    CHRONIC, DEGENERATION OF LUMBAR OR LUMBOSACRAL INTERVERTEBRAL DISC  . H/O seasonal allergies   . History of positive PPD    NORMAL CHEST XRAY 09/1998  . Hyperlipidemia   . Hypertension   . Neuromuscular disorder (HCC)    NEURALGIA AND NEURITIS  . Restless leg syndrome   . 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 ANKLE ARTHROSCOPY Right 11/11/2015   Procedure: ANKLE ARTHROSCOPY/OCD REPAIR;  Surgeon: Samara Deist, DPM;  Location: [place];  Service: Podiatry;  Laterality: Right;  POPLITEAL  . ANTERIOR TALOFIBULAR LIGAMENT REPAIR Right 11/11/2015   Procedure: ANKLE BROSTROM-GOULD PROCEDURE;  Surgeon: Samara Deist, DPM;  Location: [place];  Service: Podiatry;  Laterality: Right;  . BACK SURGERY     L5-S1 DISCECTOMY, NUMBNESS LEFT BIG TOE  . COLONOSCOPY    . FUNCTIONAL ENDOSCOPIC SINUS SURGERY    . TENDON REPAIR Right 11/11/2015   Procedure: FLEXOR TENDON REPAIR-SECOND, TENOLYSIS-SINGLE;  Surgeon: Samara Deist, DPM;  Location: [place];  Service: Podiatry;  Laterality: Right;     There were no vitals filed for this visit.      Subjective Assessment - 12/23/15 0855    Subjective Patient reports slight increase in soreness today after performing increased standing activity the day prior.    Pertinent History Patient fell of a ladder 5 months ago and fractured his foot. His surgery was 11/03/15.    Limitations Standing;Walking   How long can you sit comfortably? siiting is not a problem   How long can you stand comfortably? 15 minutes   How long can you walk comfortably? 1-2 minutes   Patient Stated Goals to be able to ambulate without AD and go back to work.   Currently in Pain? No/denies      TREATMENT Manual Therapy Performed STM to the dorsum and plantar aspect of the foot to alleviate inflammation and swelling in the foot/ankle. Grade III mobs to the TC and ST joint to alleviate foot/ankle pain. Grade III mobs to metatarsal digits I-V to decrease pain and spasms.  Therapeutic Exercise Towel Scrunches - x 2 min  Marches on airex pad - 2 x 20 Alphabet with ankle motions - x 1 throughout ankle BAPS board - x 30 inversion/eversion, anterior/posterior, circles (clockwise/counterclockwise) Weight shifting laterally in standing left/right - x 20 Amb without walking boot - 30 ft with cueing to improve push off Weight shifting laterally, anterior/posterior on airex - 2x20  Wobbleboard laterally - x 20 with cueing on knee and ankle  positioning throughout movement. Dorsiflexion/plantarflexion and eversion/inversion - x 20 without resistance      PT Education - 12/23/15 0857    Education provided Yes   Education Details HEP: amb with UE support for balance at home   Person(s) Educated Patient   Methods Explanation;Demonstration   Comprehension Verbalized understanding;Returned demonstration             PT Long Term Goals - 12/13/15 1459      PT LONG TERM GOAL #1   Title Patient will reduce timed up and go to <11 seconds to reduce fall risk and  demonstrate improved transfer/gait ability.   Time 8   Period Weeks   Status New     PT LONG TERM GOAL #2   Title Patient will increase 10 meter walk test to >1.60m/s as to improve gait speed for better community ambulation and to reduce fall risk   Time 8   Period Weeks   Status New     PT LONG TERM GOAL #3   Title Patient will increase R ankle  gross strength to 4+/5 as to improve functional strength for independent gait, increased standing tolerance and increased ADL ability.   Baseline 1/5   Time 8   Period Weeks   Status New     PT LONG TERM GOAL #4   Title Patient will report a worst pain of 3/10 on VAS in right ankle to improve tolerance with ADLs and reduced symptoms with activities.    Baseline 5/10   Time 8   Period Weeks   Status New     PT LONG TERM GOAL #5   Title Patient will increase six minute walk test distance to >1000 for progression to community ambulator and improve gait ability   Time 8   Period Weeks   Status New     Additional Long Term Goals   Additional Long Term Goals Yes               Plan - 12/23/15 0900    Clinical Impression Statement Performed more weight bearing exercises in standing today. Patient demonstrates ability to bear full weight onto R LE while performing marches with minimal increase in soreness and denies sharp pain. Patient demonstrates improved motor control in the R ankle but continues to demonstrate deficits. Patient will benefit from further skilled therapy to return to prior level of function.    Rehab Potential Good   Clinical Impairments Affecting Rehab Potential chronic ankle injury   PT Frequency 2x / week   PT Duration 8 weeks   PT Treatment/Interventions Cryotherapy;Moist Heat;Ultrasound;Therapeutic activities;Gait training;Therapeutic exercise;Balance training;Neuromuscular re-education;Passive range of motion   PT Next Visit Plan YTB DF, BAPS board DF/PF    PT Home Exercise Plan YTB PF    Consulted and Agree  with Plan of Care Patient      Patient will benefit from skilled therapeutic intervention in order to improve the following deficits and impairments:  Abnormal gait, Decreased balance, Decreased endurance, Decreased mobility, Difficulty walking, Decreased activity tolerance, Impaired flexibility, Pain, Decreased strength, Impaired sensation  Visit Diagnosis: Difficulty in walking, not elsewhere classified  Muscle weakness (generalized)     Problem List There are no active problems to display for this patient.   Blythe Stanford, PT DPT 12/23/2015, 9:03 AM  [place] MAIN [facility] SERVICES [address] [place], [place], [address] Phone: [phone]   Fax:  [phone]  Name: Dennis Lawrence MRN: [id number] Date of Birth:  10/12/1950    

## 2015-12-27 ENCOUNTER — Ambulatory Visit: Payer: BLUE CROSS/BLUE SHIELD | Attending: Podiatry

## 2015-12-27 DIAGNOSIS — M6281 Muscle weakness (generalized): Secondary | ICD-10-CM | POA: Insufficient documentation

## 2015-12-27 DIAGNOSIS — R262 Difficulty in walking, not elsewhere classified: Secondary | ICD-10-CM | POA: Diagnosis not present

## 2015-12-27 NOTE — Therapy (Signed)
[place] MAIN [facility] SERVICES [address] [place], [place], [address] Phone: [phone]   Fax:  [phone]  Physical Therapy Treatment  Patient Details  Name: Dennis Lawrence MRN: [id number] Date of Birth: May 23, 1950 Referring Provider: Samara Deist  Encounter Date: 12/27/2015      PT End of Session - 12/27/15 0941    Visit Number 4   Number of Visits 17   Date for PT Re-Evaluation 01/31/16   Authorization Type blue cross blue shield   PT Start Time 0850   PT Stop Time 0935   PT Time Calculation (min) 45 min   Equipment Utilized During Treatment Gait belt   Activity Tolerance Patient tolerated treatment well;Patient limited by fatigue      Past Medical History:  Diagnosis Date  . Arthritis    HANDS  . Back pain    CHRONIC, DEGENERATION OF LUMBAR OR LUMBOSACRAL INTERVERTEBRAL DISC  . H/O seasonal allergies   . History of positive PPD    NORMAL CHEST XRAY 09/1998  . Hyperlipidemia   . Hypertension   . Neuromuscular disorder (HCC)    NEURALGIA AND NEURITIS  . Restless leg syndrome   . 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 ANKLE ARTHROSCOPY Right 11/11/2015   Procedure: ANKLE ARTHROSCOPY/OCD REPAIR;  Surgeon: Samara Deist, DPM;  Location: [place];  Service: Podiatry;  Laterality: Right;  POPLITEAL  . ANTERIOR TALOFIBULAR LIGAMENT REPAIR Right 11/11/2015   Procedure: ANKLE BROSTROM-GOULD PROCEDURE;  Surgeon: Samara Deist, DPM;  Location: [place];  Service: Podiatry;  Laterality: Right;  . BACK SURGERY     L5-S1 DISCECTOMY, NUMBNESS LEFT BIG TOE  . COLONOSCOPY    . FUNCTIONAL ENDOSCOPIC SINUS SURGERY    . TENDON REPAIR Right 11/11/2015   Procedure: FLEXOR TENDON REPAIR-SECOND, TENOLYSIS-SINGLE;  Surgeon: Samara Deist, DPM;  Location: [place];  Service: Podiatry;  Laterality: Right;     There were no vitals filed for this visit.      Subjective Assessment - 12/27/15 0938    Subjective Patient reports increased swelling and soreness in the ankle over the weekend after performing increased weight bearing. Patient states no pain in the ankle currently.    Pertinent History Patient fell of a ladder 5 months ago and fractured his foot. His surgery was 11/03/15.    Limitations Standing;Walking   How long can you sit comfortably? siiting is not a problem   How long can you stand comfortably? 15 minutes   How long can you walk comfortably? 1-2 minutes   Patient Stated Goals to be able to ambulate without AD and go back to work.   Currently in Pain? No/denies      TREATMENT Manual Therapy Performed STM to the dorsum and plantar aspect of the foot to alleviate inflammation and swelling in the foot/ankle. Grade III mobs to the TC and ST joint to alleviate foot/ankle pain. Grade III mobs to metatarsal digits I-V to decrease pain and spasms.  Therapeutic Exercise Marches on airex pad - 2 x 20 Dorsiflexion/plantarflexion and eversion/inversion - 2 min each direction BAPS board - x 30 inversion/eversion, anterior/posterior, circles (clockwise/counterclockwise)  Weight shifting laterally in standing left/right & forward/backward on airex- x 20 Single leg stance with UE support - 3 x 30sec  Side stepping at treadmill left/right - x15 each direction Amb without walking boot - 30 ft  with cueing to improve push off; patient demonstrates widened BOS and shortened steps Bilat Arch up with tactile cueing to the plantar aspect of the foot - x 20 B           PT Education - 12/27/15 0941    Education provided Yes   Education Details HEP: side stepping with UE support for balance   Person(s) Educated Patient   Methods Explanation;Demonstration   Comprehension Verbalized understanding;Returned demonstration             PT Long Term Goals - 12/13/15 1459      PT LONG  TERM GOAL #1   Title Patient will reduce timed up and go to <11 seconds to reduce fall risk and demonstrate improved transfer/gait ability.   Time 8   Period Weeks   Status New     PT LONG TERM GOAL #2   Title Patient will increase 10 meter walk test to >1.37m/s as to improve gait speed for better community ambulation and to reduce fall risk   Time 8   Period Weeks   Status New     PT LONG TERM GOAL #3   Title Patient will increase R ankle  gross strength to 4+/5 as to improve functional strength for independent gait, increased standing tolerance and increased ADL ability.   Baseline 1/5   Time 8   Period Weeks   Status New     PT LONG TERM GOAL #4   Title Patient will report a worst pain of 3/10 on VAS in right ankle to improve tolerance with ADLs and reduced symptoms with activities.    Baseline 5/10   Time 8   Period Weeks   Status New     PT LONG TERM GOAL #5   Title Patient will increase six minute walk test distance to >1000 for progression to community ambulator and improve gait ability   Time 8   Period Weeks   Status New     Additional Long Term Goals   Additional Long Term Goals Yes               Plan - 12/27/15 0943    Clinical Impression Statement Performed greater single leg stance and introduced lateral movements today to improve ankle stability. Patient tolerated advancement in treatment well with minimal increase in soreness. Although patient improving he continues to demonstrate decreased coordination and AROM in the affected ankle and will benefit from further skilled therapy to return to prior level of function.    Rehab Potential Good   Clinical Impairments Affecting Rehab Potential chronic ankle injury   PT Frequency 2x / week   PT Duration 8 weeks   PT Treatment/Interventions Cryotherapy;Moist Heat;Ultrasound;Therapeutic activities;Gait training;Therapeutic exercise;Balance training;Neuromuscular re-education;Passive range of motion   PT Next  Visit Plan YTB DF, BAPS board DF/PF    PT Home Exercise Plan YTB PF    Consulted and Agree with Plan of Care Patient      Patient will benefit from skilled therapeutic intervention in order to improve the following deficits and impairments:  Abnormal gait, Decreased balance, Decreased endurance, Decreased mobility, Difficulty walking, Decreased activity tolerance, Impaired flexibility, Pain, Decreased strength, Impaired sensation  Visit Diagnosis: Difficulty in walking, not elsewhere classified  Muscle weakness (generalized)     Problem List There are no active problems to display for this patient.   Blythe Stanford, PT DPT 12/27/2015, 9:47 AM  [place] MAIN [facility] SERVICES [place],  [place], [address] Phone: [phone]   Fax:  [phone]  Name: Dennis Lawrence MRN: [id number] Date of Birth: 05/22/1950

## 2015-12-28 ENCOUNTER — Encounter: Payer: BLUE CROSS/BLUE SHIELD | Admitting: Physical Therapy

## 2015-12-30 ENCOUNTER — Encounter: Payer: BLUE CROSS/BLUE SHIELD | Admitting: Physical Therapy

## 2015-12-30 ENCOUNTER — Ambulatory Visit: Payer: BLUE CROSS/BLUE SHIELD

## 2015-12-30 DIAGNOSIS — R262 Difficulty in walking, not elsewhere classified: Secondary | ICD-10-CM

## 2015-12-30 DIAGNOSIS — M6281 Muscle weakness (generalized): Secondary | ICD-10-CM

## 2015-12-30 NOTE — Therapy (Signed)
[place] MAIN [facility] SERVICES [address] [place], [place], [address] Phone: [phone]   Fax:  [phone]  Physical Therapy Treatment  Patient Details  Name: Dennis Lawrence MRN: [id number] Date of Birth: 1950/06/23 Referring Provider: Samara Deist  Encounter Date: 12/30/2015      PT End of Session - 12/30/15 0853    Visit Number 5   Number of Visits 17   Date for PT Re-Evaluation 01/31/16   Authorization Type blue cross blue shield   PT Start Time 0800   PT Stop Time 0845   PT Time Calculation (min) 45 min   Equipment Utilized During Treatment Gait belt   Activity Tolerance Patient tolerated treatment well;Patient limited by fatigue      Past Medical History:  Diagnosis Date  . Arthritis    HANDS  . Back pain    CHRONIC, DEGENERATION OF LUMBAR OR LUMBOSACRAL INTERVERTEBRAL DISC  . H/O seasonal allergies   . History of positive PPD    NORMAL CHEST XRAY 09/1998  . Hyperlipidemia   . Hypertension   . Neuromuscular disorder (HCC)    NEURALGIA AND NEURITIS  . Restless leg syndrome   . 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 ANKLE ARTHROSCOPY Right 11/11/2015   Procedure: ANKLE ARTHROSCOPY/OCD REPAIR;  Surgeon: Samara Deist, DPM;  Location: [place];  Service: Podiatry;  Laterality: Right;  POPLITEAL  . ANTERIOR TALOFIBULAR LIGAMENT REPAIR Right 11/11/2015   Procedure: ANKLE BROSTROM-GOULD PROCEDURE;  Surgeon: Samara Deist, DPM;  Location: [place];  Service: Podiatry;  Laterality: Right;  . BACK SURGERY     L5-S1 DISCECTOMY, NUMBNESS LEFT BIG TOE  . COLONOSCOPY    . FUNCTIONAL ENDOSCOPIC SINUS SURGERY    . TENDON REPAIR Right 11/11/2015   Procedure: FLEXOR TENDON REPAIR-SECOND, TENOLYSIS-SINGLE;  Surgeon: Samara Deist, DPM;  Location: [place];  Service: Podiatry;  Laterality: Right;     There were no vitals filed for this visit.      Subjective Assessment - 12/30/15 [id number]    Subjective Patient reports minimal increase in swelling in the ankle at the end of day. Patient reports no increase in pain in the ankle.    Pertinent History Patient fell of a ladder 5 months ago and fractured his foot. His surgery was 11/03/15.    Limitations Standing;Walking   How long can you sit comfortably? siiting is not a problem   How long can you stand comfortably? 15 minutes   How long can you walk comfortably? 1-2 minutes   Patient Stated Goals to be able to ambulate without AD and go back to work.   Currently in Pain? No/denies      TREATMENT Manual Therapy Performed STM to the dorsum and plantar aspect of the foot to alleviate inflammation and swelling in the foot/ankle. Grade III mobs to the TC and ST joint to alleviate foot/ankle pain. Grade III mobs to metatarsal digits I-V to decrease pain and spasms.   Therapeutic Exercise Marches on airex pad - 2 x 20 Dorsiflexion/plantarflexion and eversion/inversion - 2 min each direction BAPS board - x 30 inversion/eversion, anterior/posterior, circles (clockwise/counterclockwise)  Single leg stance with UE support on airex - 3 x 30sec  Side stepping at treadmill left/right - x15 each direction Amb without walking boot - 40 ft with cueing to improve push off; patient demonstrates widened BOS and  shortened steps Bilat Semi tandem stance with UE support- 2 x 26min and intermittent UE support Forward step ups on airex pad --  x 10 B with intermittent UE support       PT Education - 12/30/15 0851    Education provided Yes   Education Details Educated to amb out of the boot at home with UE support. Educated to amb with walking boot at community distances using the scooter when the ankle feels fatigued   Person(s) Educated Patient   Methods Explanation;Demonstration   Comprehension Verbalized understanding;Returned demonstration              PT Long Term Goals - 12/13/15 1459      PT LONG TERM GOAL #1   Title Patient will reduce timed up and go to <11 seconds to reduce fall risk and demonstrate improved transfer/gait ability.   Time 8   Period Weeks   Status New     PT LONG TERM GOAL #2   Title Patient will increase 10 meter walk test to >1.71m/s as to improve gait speed for better community ambulation and to reduce fall risk   Time 8   Period Weeks   Status New     PT LONG TERM GOAL #3   Title Patient will increase R ankle  gross strength to 4+/5 as to improve functional strength for independent gait, increased standing tolerance and increased ADL ability.   Baseline 1/5   Time 8   Period Weeks   Status New     PT LONG TERM GOAL #4   Title Patient will report a worst pain of 3/10 on VAS in right ankle to improve tolerance with ADLs and reduced symptoms with activities.    Baseline 5/10   Time 8   Period Weeks   Status New     PT LONG TERM GOAL #5   Title Patient will increase six minute walk test distance to >1000 for progression to community ambulator and improve gait ability   Time 8   Period Weeks   Status New     Additional Long Term Goals   Additional Long Term Goals Yes               Plan - 12/30/15 0853    Clinical Impression Statement Patient demonstrates decreased motor coordination with inability to perform BAPS board circular movements without therapist support. Patient able to perform greater weight bearing exercises today versus previous visits indicating functional carryover. Patient will benefit from further skilled therapy focused on improving ankle stabilization and weight bearing exercises to return to prior level of function.    Rehab Potential Good   Clinical Impairments Affecting Rehab Potential chronic ankle injury   PT Frequency 2x / week   PT Duration 8 weeks   PT Treatment/Interventions Cryotherapy;Moist Heat;Ultrasound;Therapeutic activities;Gait  training;Therapeutic exercise;Balance training;Neuromuscular re-education;Passive range of motion   PT Next Visit Plan YTB DF, BAPS board DF/PF    PT Home Exercise Plan YTB PF    Consulted and Agree with Plan of Care Patient      Patient will benefit from skilled therapeutic intervention in order to improve the following deficits and impairments:  Abnormal gait, Decreased balance, Decreased endurance, Decreased mobility, Difficulty walking, Decreased activity tolerance, Impaired flexibility, Pain, Decreased strength, Impaired sensation  Visit Diagnosis: Difficulty in walking, not elsewhere classified  Muscle weakness (generalized)     Problem List There are no active problems to display for this patient.   Blythe Stanford, PT DPT  12/30/2015, 8:56 AM  [place] MAIN [facility] SERVICES [address] [place], [place], [address] Phone: [phone]   Fax:  [phone]  Name: Dennis Lawrence MRN: [id number] Date of Birth: 1950/03/31

## 2016-01-04 ENCOUNTER — Ambulatory Visit: Payer: BLUE CROSS/BLUE SHIELD

## 2016-01-04 ENCOUNTER — Encounter: Payer: BLUE CROSS/BLUE SHIELD | Admitting: Physical Therapy

## 2016-01-04 DIAGNOSIS — R262 Difficulty in walking, not elsewhere classified: Secondary | ICD-10-CM | POA: Diagnosis not present

## 2016-01-04 DIAGNOSIS — M6281 Muscle weakness (generalized): Secondary | ICD-10-CM

## 2016-01-04 NOTE — Therapy (Signed)
[place] MAIN [facility] SERVICES [address] [place], [place], [address] Phone: [phone]   Fax:  [phone]  Physical Therapy Treatment  Patient Details  Name: Dennis Lawrence MRN: [id number] Date of Birth: Mar 26, 1950 Referring Provider: Samara Deist  Encounter Date: 01/04/2016      PT End of Session - 01/04/16 0902    Visit Number 6   Number of Visits 17   Date for PT Re-Evaluation 01/31/16   Authorization Type blue cross blue shield   PT Start Time 0800   PT Stop Time 0845   PT Time Calculation (min) 45 min   Activity Tolerance Patient tolerated treatment well;Patient limited by fatigue      Past Medical History:  Diagnosis Date  . Arthritis    HANDS  . Back pain    CHRONIC, DEGENERATION OF LUMBAR OR LUMBOSACRAL INTERVERTEBRAL DISC  . H/O seasonal allergies   . History of positive PPD    NORMAL CHEST XRAY 09/1998  . Hyperlipidemia   . Hypertension   . Neuromuscular disorder (HCC)    NEURALGIA AND NEURITIS  . Restless leg syndrome   . 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 ANKLE ARTHROSCOPY Right 11/11/2015   Procedure: ANKLE ARTHROSCOPY/OCD REPAIR;  Surgeon: Samara Deist, DPM;  Location: [place];  Service: Podiatry;  Laterality: Right;  POPLITEAL  . ANTERIOR TALOFIBULAR LIGAMENT REPAIR Right 11/11/2015   Procedure: ANKLE BROSTROM-GOULD PROCEDURE;  Surgeon: Samara Deist, DPM;  Location: [place];  Service: Podiatry;  Laterality: Right;  . BACK SURGERY     L5-S1 DISCECTOMY, NUMBNESS LEFT BIG TOE  . COLONOSCOPY    . FUNCTIONAL ENDOSCOPIC SINUS SURGERY    . TENDON REPAIR Right 11/11/2015   Procedure: FLEXOR TENDON REPAIR-SECOND, TENOLYSIS-SINGLE;  Surgeon: Samara Deist, DPM;  Location: [place];  Service: Podiatry;  Laterality: Right;    There were no vitals filed for this visit.       Subjective Assessment - 01/04/16 0839    Subjective Patient reports minimal soreness in the ankle after previous treatment.    Pertinent History Patient fell of a ladder 5 months ago and fractured his foot. His surgery was 11/03/15.    Limitations Standing;Walking   How long can you sit comfortably? siiting is not a problem   How long can you stand comfortably? 15 minutes   How long can you walk comfortably? 1-2 minutes   Patient Stated Goals to be able to ambulate without AD and go back to work.   Currently in Pain? No/denies      TREATMENT Manual Therapy Performed STM to the dorsum and plantar aspect of the foot to alleviate inflammation and swelling in the foot/ankle. Grade III mobs to the TC and ST joint to alleviate foot/ankle pain. Grade III mobs to metatarsal digits I-V to decrease pain and spasms. Medial/lateral subtalar glides grade III-IV.   Therapeutic Exercise Forward step ups on airex pad - x15  Side stepping at treadmill left/right - x15 each direction Rotational step ups on airex pad - x15 Cone taps from airex pad - x15 B        PT Education - 01/04/16 0901    Education provided Yes   Education Details Educated on form/technique throughout session   Person(s) Educated Patient   Methods Explanation;Demonstration   Comprehension Verbalized understanding;Returned demonstration  PT Long Term Goals - 12/13/15 1459      PT LONG TERM GOAL #1   Title Patient will reduce timed up and go to <11 seconds to reduce fall risk and demonstrate improved transfer/gait ability.   Time 8   Period Weeks   Status New     PT LONG TERM GOAL #2   Title Patient will increase 10 meter walk test to >1.7m/s as to improve gait speed for better community ambulation and to reduce fall risk   Time 8   Period Weeks   Status New     PT LONG TERM GOAL #3   Title Patient will increase R ankle  gross strength to 4+/5 as to improve functional strength for independent  gait, increased standing tolerance and increased ADL ability.   Baseline 1/5   Time 8   Period Weeks   Status New     PT LONG TERM GOAL #4   Title Patient will report a worst pain of 3/10 on VAS in right ankle to improve tolerance with ADLs and reduced symptoms with activities.    Baseline 5/10   Time 8   Period Weeks   Status New     PT LONG TERM GOAL #5   Title Patient will increase six minute walk test distance to >1000 for progression to community ambulator and improve gait ability   Time 8   Period Weeks   Status New     Additional Long Term Goals   Additional Long Term Goals Yes               Plan - 01/04/16 0902    Clinical Impression Statement Performed shorter session today secondary to patient being 15 min late for appointment. Introduced rotational movements into treatment session to improve ankle stability and patient tolerated treatment well without increase in symptoms. Plan to slowly progress patient out of boot to improve ankle stability and patient will benefit from further skilled therapy to return to prior level of function.    Rehab Potential Good   Clinical Impairments Affecting Rehab Potential chronic ankle injury   PT Frequency 2x / week   PT Duration 8 weeks   PT Treatment/Interventions Cryotherapy;Moist Heat;Ultrasound;Therapeutic activities;Gait training;Therapeutic exercise;Balance training;Neuromuscular re-education;Passive range of motion   PT Next Visit Plan YTB DF, BAPS board DF/PF    PT Home Exercise Plan YTB PF    Consulted and Agree with Plan of Care Patient      Patient will benefit from skilled therapeutic intervention in order to improve the following deficits and impairments:  Abnormal gait, Decreased balance, Decreased endurance, Decreased mobility, Difficulty walking, Decreased activity tolerance, Impaired flexibility, Pain, Decreased strength, Impaired sensation  Visit Diagnosis: Difficulty in walking, not elsewhere  classified  Muscle weakness (generalized)     Problem List There are no active problems to display for this patient.   Blythe Stanford, PT DPT 01/04/2016, 9:10 AM  [place] MAIN [facility] SERVICES [address] [place], [place], [address] Phone: [phone]   Fax:  [phone]  Name: Dennis Lawrence MRN: [id number] Date of Birth: 06-Oct-1950

## 2016-01-06 ENCOUNTER — Encounter: Payer: BLUE CROSS/BLUE SHIELD | Admitting: Physical Therapy

## 2016-01-06 ENCOUNTER — Ambulatory Visit: Payer: BLUE CROSS/BLUE SHIELD

## 2016-01-06 DIAGNOSIS — R262 Difficulty in walking, not elsewhere classified: Secondary | ICD-10-CM | POA: Diagnosis not present

## 2016-01-06 DIAGNOSIS — M6281 Muscle weakness (generalized): Secondary | ICD-10-CM

## 2016-01-06 NOTE — Therapy (Signed)
[place] MAIN [facility] SERVICES [address] [place], [place], [address] Phone: [phone]   Fax:  [phone]  Physical Therapy Treatment  Patient Details  Name: Dennis Lawrence MRN: [id number] Date of Birth: 1950/07/12 Referring Provider: Samara Deist  Encounter Date: 01/06/2016      PT End of Session - 01/06/16 0857    Visit Number 7   Number of Visits 17   Date for PT Re-Evaluation 01/31/16   Authorization Type blue cross blue shield   PT Start Time 0800   PT Stop Time 0845   PT Time Calculation (min) 45 min   Activity Tolerance Patient tolerated treatment well;Patient limited by fatigue      Past Medical History:  Diagnosis Date  . Arthritis    HANDS  . Back pain    CHRONIC, DEGENERATION OF LUMBAR OR LUMBOSACRAL INTERVERTEBRAL DISC  . H/O seasonal allergies   . History of positive PPD    NORMAL CHEST XRAY 09/1998  . Hyperlipidemia   . Hypertension   . Neuromuscular disorder (HCC)    NEURALGIA AND NEURITIS  . Restless leg syndrome   . 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 ANKLE ARTHROSCOPY Right 11/11/2015   Procedure: ANKLE ARTHROSCOPY/OCD REPAIR;  Surgeon: Samara Deist, DPM;  Location: [place];  Service: Podiatry;  Laterality: Right;  POPLITEAL  . ANTERIOR TALOFIBULAR LIGAMENT REPAIR Right 11/11/2015   Procedure: ANKLE BROSTROM-GOULD PROCEDURE;  Surgeon: Samara Deist, DPM;  Location: [place];  Service: Podiatry;  Laterality: Right;  . BACK SURGERY     L5-S1 DISCECTOMY, NUMBNESS LEFT BIG TOE  . COLONOSCOPY    . FUNCTIONAL ENDOSCOPIC SINUS SURGERY    . TENDON REPAIR Right 11/11/2015   Procedure: FLEXOR TENDON REPAIR-SECOND, TENOLYSIS-SINGLE;  Surgeon: Samara Deist, DPM;  Location: [place];  Service: Podiatry;  Laterality: Right;    There were no vitals filed for this visit.       Subjective Assessment - 01/06/16 0825    Subjective Patient reports minimal soreness on the top of the ankle   Pertinent History Patient fell of a ladder 5 months ago and fractured his foot. His surgery was 11/03/15.    Limitations Standing;Walking   How long can you sit comfortably? siiting is not a problem   How long can you stand comfortably? 15 minutes   How long can you walk comfortably? 1-2 minutes   Patient Stated Goals to be able to ambulate without AD and go back to work.   Currently in Pain? No/denies      TREATMENT Manual Therapy Performed STM to the dorsum and plantar aspect of the foot to alleviate inflammation and swelling in the foot/ankle. Grade III mobs to the TC and ST joint to alleviate foot/ankle pain. Grade III mobs to metatarsal digits I-V to decrease pain and spasms. Medial/lateral subtalar glides grade III-IV.   Therapeutic Exercise Marches on airex pad - x25 B Heel raises on airex pad - x20 performed  Side stepping up and over airex pad - x15 each direction BAPS level 3 - circles clockwise/counter clockwise -- x1.5 min each way Semi tandem stance trunk rotations - x20 Rotational step ups on airex pad - x15 Leg Press at quantum - quadriceps activation: 135# x25; Heel raises: x25 135        PT Education - 01/06/16 0856    Education  provided Yes   Education Details Educated on form/technique throughout session   Person(s) Educated Patient   Methods Explanation;Demonstration   Comprehension Verbalized understanding;Returned demonstration             PT Long Term Goals - 12/13/15 1459      PT LONG TERM GOAL #1   Title Patient will reduce timed up and go to <11 seconds to reduce fall risk and demonstrate improved transfer/gait ability.   Time 8   Period Weeks   Status New     PT LONG TERM GOAL #2   Title Patient will increase 10 meter walk test to >1.48m/s as to improve gait speed for better community ambulation and to reduce fall risk   Time  8   Period Weeks   Status New     PT LONG TERM GOAL #3   Title Patient will increase R ankle  gross strength to 4+/5 as to improve functional strength for independent gait, increased standing tolerance and increased ADL ability.   Baseline 1/5   Time 8   Period Weeks   Status New     PT LONG TERM GOAL #4   Title Patient will report a worst pain of 3/10 on VAS in right ankle to improve tolerance with ADLs and reduced symptoms with activities.    Baseline 5/10   Time 8   Period Weeks   Status New     PT LONG TERM GOAL #5   Title Patient will increase six minute walk test distance to >1000 for progression to community ambulator and improve gait ability   Time 8   Period Weeks   Status New     Additional Long Term Goals   Additional Long Term Goals Yes               Plan - 01/06/16 0857    Clinical Impression Statement Added heel raises today to improve muscular strength and stability of the ankle. Patient able to perform rotational movements with minimal increase in soreness indicating functional carryover between visits. Patient will benefit from further skilled therapy to return to prior level of function.    Rehab Potential Good   Clinical Impairments Affecting Rehab Potential chronic ankle injury   PT Frequency 2x / week   PT Duration 8 weeks   PT Treatment/Interventions Cryotherapy;Moist Heat;Ultrasound;Therapeutic activities;Gait training;Therapeutic exercise;Balance training;Neuromuscular re-education;Passive range of motion   PT Next Visit Plan YTB DF, BAPS board DF/PF    PT Home Exercise Plan YTB PF    Consulted and Agree with Plan of Care Patient      Patient will benefit from skilled therapeutic intervention in order to improve the following deficits and impairments:  Abnormal gait, Decreased balance, Decreased endurance, Decreased mobility, Difficulty walking, Decreased activity tolerance, Impaired flexibility, Pain, Decreased strength, Impaired  sensation  Visit Diagnosis: Difficulty in walking, not elsewhere classified  Muscle weakness (generalized)     Problem List There are no active problems to display for this patient.   Blythe Stanford, PT DPT 01/06/2016, 9:02 AM  [place] MAIN [facility] SERVICES [address] [place], [place], [address] Phone: [phone]   Fax:  [phone]  Name: Dennis Lawrence MRN: [id number] Date of Birth: Oct 07, 1950

## 2016-01-11 ENCOUNTER — Ambulatory Visit: Payer: BLUE CROSS/BLUE SHIELD | Admitting: Physical Therapy

## 2016-01-11 ENCOUNTER — Encounter: Payer: BLUE CROSS/BLUE SHIELD | Admitting: Physical Therapy

## 2016-01-11 DIAGNOSIS — R262 Difficulty in walking, not elsewhere classified: Secondary | ICD-10-CM

## 2016-01-11 DIAGNOSIS — M6281 Muscle weakness (generalized): Secondary | ICD-10-CM

## 2016-01-11 NOTE — Therapy (Signed)
[place] MAIN [facility] SERVICES [address] [place], [place], [address] Phone: [phone]   Fax:  [phone]  Physical Therapy Treatment  Patient Details  Name: Dennis Lawrence MRN: [id number] Date of Birth: September 08, 1950 Referring Provider: Samara Deist  Encounter Date: 01/11/2016      PT End of Session - 01/11/16 0858    Visit Number 8   Number of Visits 17   Date for PT Re-Evaluation 01/31/16   Authorization Type blue cross blue shield   PT Start Time 0806   PT Stop Time 0851   PT Time Calculation (min) 45 min   Activity Tolerance Patient tolerated treatment well   Behavior During Therapy [facility] for tasks assessed/performed      Past Medical History:  Diagnosis Date  . Arthritis    HANDS  . Back pain    CHRONIC, DEGENERATION OF LUMBAR OR LUMBOSACRAL INTERVERTEBRAL DISC  . H/O seasonal allergies   . History of positive PPD    NORMAL CHEST XRAY 09/1998  . Hyperlipidemia   . Hypertension   . Neuromuscular disorder (HCC)    NEURALGIA AND NEURITIS  . Restless leg syndrome   . 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 ANKLE ARTHROSCOPY Right 11/11/2015   Procedure: ANKLE ARTHROSCOPY/OCD REPAIR;  Surgeon: Samara Deist, DPM;  Location: [place];  Service: Podiatry;  Laterality: Right;  POPLITEAL  . ANTERIOR TALOFIBULAR LIGAMENT REPAIR Right 11/11/2015   Procedure: ANKLE BROSTROM-GOULD PROCEDURE;  Surgeon: Samara Deist, DPM;  Location: [place];  Service: Podiatry;  Laterality: Right;  . BACK SURGERY     L5-S1 DISCECTOMY, NUMBNESS LEFT BIG TOE  . COLONOSCOPY    . FUNCTIONAL ENDOSCOPIC SINUS SURGERY    . TENDON REPAIR Right 11/11/2015   Procedure: FLEXOR TENDON REPAIR-SECOND, TENOLYSIS-SINGLE;  Surgeon: Samara Deist, DPM;  Location: [place];  Service: Podiatry;  Laterality: Right;    There were no  vitals filed for this visit.      Subjective Assessment - 01/11/16 0809    Subjective Pt reports he is doing well this date.  He has been completing his stretches at home.  No other complaints or concerns.   Pertinent History Patient fell of a ladder 5 months ago and fractured his foot. His surgery was 11/03/15.    Limitations Standing;Walking   How long can you sit comfortably? siiting is not a problem   How long can you stand comfortably? 15 minutes   How long can you walk comfortably? 1-2 minutes   Patient Stated Goals to be able to ambulate without AD and go back to work.   Currently in Pain? Yes   Pain Score 2    Pain Location Ankle   Pain Orientation Right   Pain Descriptors / Indicators Aching   Pain Type Chronic pain   Pain Onset More than a month ago   Multiple Pain Sites No       TREATMENT   Manual Therapy:  Performed STM to the dorsum and plantar aspect of the foot to alleviate inflammation and swelling in the foot/ankle. STM and cross friction to scar. Grade III mobs to the TC and ST joint to alleviate foot/ankle pain. Grade III mobs to metatarsal digits I-V to decrease pain and spasms. 15 minutes total.   Therapeutic Exercise:  Side stepping up and over airex pad - x15 each direction  BAPS level 3 -?circles clockwise/counter clockwise -- x1.5 min each way  BAPS level 3 eversion/inversion x30 seconds each direction  Ambulating in gym 2x2m with cues for DF progressing through stance phase RLE. Pt reports moderate stretch in calf with this.  Marches on airex pad. x20 Bil  Heel raises on airex pad -?x10 performed  Semi tandem stance trunk rotations. 1x10 each direction  Seated R DF with GTB resistance 2x10              PT Education - 01/11/16 0858    Education provided Yes   Education Details Exercise technique, gait mechanics   Person(s) Educated Patient   Methods Explanation;Demonstration;Tactile cues;Verbal cues   Comprehension Returned  demonstration;Verbalized understanding;Need further instruction             PT Long Term Goals - 12/13/15 1459      PT LONG TERM GOAL #1   Title Patient will reduce timed up and go to <11 seconds to reduce fall risk and demonstrate improved transfer/gait ability.   Time 8   Period Weeks   Status New     PT LONG TERM GOAL #2   Title Patient will increase 10 meter walk test to >1.41m/s as to improve gait speed for better community ambulation and to reduce fall risk   Time 8   Period Weeks   Status New     PT LONG TERM GOAL #3   Title Patient will increase R ankle  gross strength to 4+/5 as to improve functional strength for independent gait, increased standing tolerance and increased ADL ability.   Baseline 1/5   Time 8   Period Weeks   Status New     PT LONG TERM GOAL #4   Title Patient will report a worst pain of 3/10 on VAS in right ankle to improve tolerance with ADLs and reduced symptoms with activities.    Baseline 5/10   Time 8   Period Weeks   Status New     PT LONG TERM GOAL #5   Title Patient will increase six minute walk test distance to >1000 for progression to community ambulator and improve gait ability   Time 8   Period Weeks   Status New     Additional Long Term Goals   Additional Long Term Goals Yes               Plan - 01/11/16 0859    Clinical Impression Statement Pt tolerated all interventions well this date.  Initiated scar massage today to improve restrictions noted in this region.  Educated pt on proper gait mechanics and importance of DF while ambulating as pt ambulated in gym, reporting moderate stretch in calf.  He is making good progress and will benefit from continued skilled PT interventions for improved ROM, strength, mobility, and gait mechanics.   Rehab Potential Good   Clinical Impairments Affecting Rehab Potential chronic ankle injury   PT Frequency 2x / week   PT Duration 8 weeks   PT Treatment/Interventions  Cryotherapy;Moist Heat;Ultrasound;Therapeutic activities;Gait training;Therapeutic exercise;Balance training;Neuromuscular re-education;Passive range of motion   PT Next Visit Plan YTB DF, BAPS board DF/PF    PT Home Exercise Plan YTB PF    Consulted and Agree with Plan of Care Patient      Patient will benefit from skilled therapeutic intervention in order to improve the following deficits and impairments:  Abnormal gait, Decreased balance, Decreased endurance, Decreased mobility, Difficulty walking, Decreased activity tolerance, Impaired flexibility, Pain, Decreased  strength, Impaired sensation  Visit Diagnosis: Difficulty in walking, not elsewhere classified  Muscle weakness (generalized)     Problem List There are no active problems to display for this patient.   Collie Siad PT, DPT 01/11/2016, 9:10 AM  [place] MAIN [facility] SERVICES [address] [place], [place], [address] Phone: [phone]   Fax:  [phone]  Name: ERMA FRIERSON MRN: [id number] Date of Birth: [birth year]

## 2016-01-13 ENCOUNTER — Ambulatory Visit: Payer: BLUE CROSS/BLUE SHIELD

## 2016-01-13 ENCOUNTER — Encounter: Payer: BLUE CROSS/BLUE SHIELD | Admitting: Physical Therapy

## 2016-01-13 DIAGNOSIS — R262 Difficulty in walking, not elsewhere classified: Secondary | ICD-10-CM | POA: Diagnosis not present

## 2016-01-13 DIAGNOSIS — M6281 Muscle weakness (generalized): Secondary | ICD-10-CM

## 2016-01-13 NOTE — Therapy (Signed)
[place] MAIN [facility] SERVICES [address] [place], [place], [address] Phone: [phone]   Fax:  [phone]  Physical Therapy Treatment  Patient Details  Name: Dennis Lawrence MRN: [id number] Date of Birth: [birth year] Referring Provider: Samara Deist  Encounter Date: 01/13/2016      PT End of Session - 01/13/16 0842    Visit Number 9   Number of Visits 17   Date for PT Re-Evaluation 01/31/16   Authorization Type blue cross blue shield   PT Start Time 0806   PT Stop Time 0845   PT Time Calculation (min) 39 min   Activity Tolerance Patient tolerated treatment well   Behavior During Therapy [facility] for tasks assessed/performed      Past Medical History:  Diagnosis Date  . Arthritis    HANDS  . Back pain    CHRONIC, DEGENERATION OF LUMBAR OR LUMBOSACRAL INTERVERTEBRAL DISC  . H/O seasonal allergies   . History of positive PPD    NORMAL CHEST XRAY 09/1998  . Hyperlipidemia   . Hypertension   . Neuromuscular disorder (HCC)    NEURALGIA AND NEURITIS  . Restless leg syndrome   . 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 ANKLE ARTHROSCOPY Right 11/11/2015   Procedure: ANKLE ARTHROSCOPY/OCD REPAIR;  Surgeon: Samara Deist, DPM;  Location: [place];  Service: Podiatry;  Laterality: Right;  POPLITEAL  . ANTERIOR TALOFIBULAR LIGAMENT REPAIR Right 11/11/2015   Procedure: ANKLE BROSTROM-GOULD PROCEDURE;  Surgeon: Samara Deist, DPM;  Location: [place];  Service: Podiatry;  Laterality: Right;  . BACK SURGERY     L5-S1 DISCECTOMY, NUMBNESS LEFT BIG TOE  . COLONOSCOPY    . FUNCTIONAL ENDOSCOPIC SINUS SURGERY    . TENDON REPAIR Right 11/11/2015   Procedure: FLEXOR TENDON REPAIR-SECOND, TENOLYSIS-SINGLE;  Surgeon: Samara Deist, DPM;  Location: [place];  Service: Podiatry;  Laterality: Right;    There were no  vitals filed for this visit.      Subjective Assessment - 01/13/16 0825    Subjective Pt reports his foot is a little sore today secondary to increased walking around yesterday.    Pertinent History Patient fell of a ladder 5 months ago and fractured his foot. His surgery was 11/03/15.    Limitations Standing;Walking   How long can you sit comfortably? siiting is not a problem   How long can you stand comfortably? 15 minutes   How long can you walk comfortably? 1-2 minutes   Patient Stated Goals to be able to ambulate without AD and go back to work.   Currently in Pain? Yes   Pain Score 2    Pain Location Ankle   Pain Orientation Right   Pain Descriptors / Indicators Aching   Pain Type Chronic pain   Pain Onset More than a month ago      TREATMENT Manual Therapy Performed STM to the dorsum and plantar aspect of the foot to alleviate inflammation and swelling in the foot/ankle. Grade III mobs to the TC and ST joint to alleviate foot/ankle pain. Grade III mobs to metatarsal digits I-V to decrease pain and spasms. Medial/lateral subtalar glides grade III-IV.   Therapeutic Exercise Marches on Bosu ball  - x25 B Heel raises Blue foam- x20 performed  Side stepping up and over Bosu - x15 each direction BAPS level 3 - circles clockwise/counter clockwise --  x1.5 min each way Forward step ups onto bosu ball - x 15 Single leg stance on airex pad -  20 sec x 3 Semi tandem stance on airex - 1.5 min B       PT Education - 01/13/16 0832    Education provided Yes   Education Details Form/technique throughout exercise session    Person(s) Educated Patient   Methods Explanation;Demonstration   Comprehension Verbalized understanding;Returned demonstration             PT Long Term Goals - 12/13/15 1459      PT LONG TERM GOAL #1   Title Patient will reduce timed up and go to <11 seconds to reduce fall risk and demonstrate improved transfer/gait ability.   Time 8   Period Weeks    Status New     PT LONG TERM GOAL #2   Title Patient will increase 10 meter walk test to >1.80m/s as to improve gait speed for better community ambulation and to reduce fall risk   Time 8   Period Weeks   Status New     PT LONG TERM GOAL #3   Title Patient will increase R ankle  gross strength to 4+/5 as to improve functional strength for independent gait, increased standing tolerance and increased ADL ability.   Baseline 1/5   Time 8   Period Weeks   Status New     PT LONG TERM GOAL #4   Title Patient will report a worst pain of 3/10 on VAS in right ankle to improve tolerance with ADLs and reduced symptoms with activities.    Baseline 5/10   Time 8   Period Weeks   Status New     PT LONG TERM GOAL #5   Title Patient will increase six minute walk test distance to >1000 for progression to community ambulator and improve gait ability   Time 8   Period Weeks   Status New     Additional Long Term Goals   Additional Long Term Goals Yes               Plan - 01/13/16 0844    Clinical Impression Statement Pt demonstrates significant improvement in R ankle weight bearing and function with patient demonstrating ability to perform exercises on Bosu versus airex pad indicating improved muscular coordination. Patient continues to demonstrate difficulty with walking/balancing and will benefit from further skilled therapy to return to prior level of function.    Rehab Potential Good   Clinical Impairments Affecting Rehab Potential chronic ankle injury   PT Frequency 2x / week   PT Duration 8 weeks   PT Treatment/Interventions Cryotherapy;Moist Heat;Ultrasound;Therapeutic activities;Gait training;Therapeutic exercise;Balance training;Neuromuscular re-education;Passive range of motion   PT Next Visit Plan YTB DF, BAPS board DF/PF    PT Home Exercise Plan YTB PF    Consulted and Agree with Plan of Care Patient      Patient will benefit from skilled therapeutic intervention in order  to improve the following deficits and impairments:  Abnormal gait, Decreased balance, Decreased endurance, Decreased mobility, Difficulty walking, Decreased activity tolerance, Impaired flexibility, Pain, Decreased strength, Impaired sensation  Visit Diagnosis: Difficulty in walking, not elsewhere classified  Muscle weakness (generalized)     Problem List There are no active problems to display for this patient.   Blythe Stanford, PT DPT 01/13/2016, 8:55 AM  [place] MAIN [facility] SERVICES [address] [place], [place], [address] Phone: [phone]   Fax:  [phone]  Name: Dennis Lawrence MRN: [id number] Date of Birth: [birth year]

## 2016-01-20 ENCOUNTER — Encounter: Payer: BLUE CROSS/BLUE SHIELD | Admitting: Physical Therapy

## 2016-01-20 ENCOUNTER — Ambulatory Visit: Payer: BLUE CROSS/BLUE SHIELD

## 2016-01-20 DIAGNOSIS — R262 Difficulty in walking, not elsewhere classified: Secondary | ICD-10-CM

## 2016-01-20 DIAGNOSIS — M6281 Muscle weakness (generalized): Secondary | ICD-10-CM

## 2016-01-20 NOTE — Therapy (Signed)
[place] MAIN [facility] SERVICES [address] [place], [place], [address] Phone: [phone]   Fax:  [phone]  Physical Therapy Treatment  Patient Details  Name: Dennis Lawrence MRN: [id number] Date of Birth: Jun 08, 1950 Referring Provider: Samara Deist  Encounter Date: 01/20/2016      PT End of Session - 01/20/16 1737    Visit Number 10   Number of Visits 17   Date for PT Re-Evaluation 01/31/16   Authorization Type blue cross blue shield   PT Start Time 1645   PT Stop Time 1730   PT Time Calculation (min) 45 min   Activity Tolerance Patient tolerated treatment well   Behavior During Therapy [facility] for tasks assessed/performed      Past Medical History:  Diagnosis Date  . Arthritis    HANDS  . Back pain    CHRONIC, DEGENERATION OF LUMBAR OR LUMBOSACRAL INTERVERTEBRAL DISC  . H/O seasonal allergies   . History of positive PPD    NORMAL CHEST XRAY 09/1998  . Hyperlipidemia   . Hypertension   . Neuromuscular disorder (HCC)    NEURALGIA AND NEURITIS  . Restless leg syndrome   . 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 ANKLE ARTHROSCOPY Right 11/11/2015   Procedure: ANKLE ARTHROSCOPY/OCD REPAIR;  Surgeon: Samara Deist, DPM;  Location: [place];  Service: Podiatry;  Laterality: Right;  POPLITEAL  . ANTERIOR TALOFIBULAR LIGAMENT REPAIR Right 11/11/2015   Procedure: ANKLE BROSTROM-GOULD PROCEDURE;  Surgeon: Samara Deist, DPM;  Location: [place];  Service: Podiatry;  Laterality: Right;  . BACK SURGERY     L5-S1 DISCECTOMY, NUMBNESS LEFT BIG TOE  . COLONOSCOPY    . FUNCTIONAL ENDOSCOPIC SINUS SURGERY    . TENDON REPAIR Right 11/11/2015   Procedure: FLEXOR TENDON REPAIR-SECOND, TENOLYSIS-SINGLE;  Surgeon: Samara Deist, DPM;  Location: [place];  Service: Podiatry;  Laterality: Right;    There were no  vitals filed for this visit.      Subjective Assessment - 01/20/16 1710    Subjective Pt reports minimal pain in the ankle and states he's improving. States he's been able to walk aound in his tennis shoes without increase in pain.    Pertinent History Patient fell of a ladder 5 months ago and fractured his foot. His surgery was 11/03/15.    Limitations Standing;Walking   How long can you sit comfortably? siiting is not a problem   How long can you stand comfortably? 15 minutes   How long can you walk comfortably? 1-2 minutes   Patient Stated Goals to be able to ambulate without AD and go back to work.   Currently in Pain? Yes   Pain Score 1    Pain Location Ankle   Pain Orientation Right   Pain Descriptors / Indicators Aching   Pain Type Chronic pain   Pain Onset More than a month ago      TREATMENT Manual Therapy Performed STM to the dorsum and plantar aspect of the foot to alleviate inflammation and swelling in the foot/ankle. Grade III mobs to the TC and ST joint to alleviate foot/ankle pain. Grade III mobs to metatarsal digits I-V to decrease pain and spasms. Medial/lateral subtalar glides grade III-IV.   Therapeutic Exercise Marches on Bosu ball  - x25 B Heel raises on half foam- x20 performed  In standing: BAPS level 2 -  circles clockwise/counter clockwise; forward/backward; laterally -- x1 min each way Single leg stance  -  45 sec x 2 Towel scrunches in sitting - 2 min Windshield wipers in sitting - 2 min with 2 sec hold on each side Treadmill ambulation forward with cueing on heel strike and push off - 5 min grade 2% Leg Press at quantum - 135# x 30       PT Education - 01/20/16 1736    Education provided Yes   Education Details Form/technique throughout session   Person(s) Educated Patient   Methods Explanation;Demonstration   Comprehension Verbalized understanding;Returned demonstration             PT Long Term Goals - 12/13/15 1459      PT LONG TERM  GOAL #1   Title Patient will reduce timed up and go to <11 seconds to reduce fall risk and demonstrate improved transfer/gait ability.   Time 8   Period Weeks   Status New     PT LONG TERM GOAL #2   Title Patient will increase 10 meter walk test to >1.59m/s as to improve gait speed for better community ambulation and to reduce fall risk   Time 8   Period Weeks   Status New     PT LONG TERM GOAL #3   Title Patient will increase R ankle  gross strength to 4+/5 as to improve functional strength for independent gait, increased standing tolerance and increased ADL ability.   Baseline 1/5   Time 8   Period Weeks   Status New     PT LONG TERM GOAL #4   Title Patient will report a worst pain of 3/10 on VAS in right ankle to improve tolerance with ADLs and reduced symptoms with activities.    Baseline 5/10   Time 8   Period Weeks   Status New     PT LONG TERM GOAL #5   Title Patient will increase six minute walk test distance to >1000 for progression to community ambulator and improve gait ability   Time 8   Period Weeks   Status New     Additional Long Term Goals   Additional Long Term Goals Yes               Plan - 01/20/16 1738    Clinical Impression Statement Patient continues to demonstrate significant improvement in R ankle function demonstrating improved ability to ambulate without use of walking boot. Although patient is improving, he continues to demonstrate ankle weakness and increased soreness with weight bearing activity indicating decrease coordination and endurance and patient will benefit from further skilled therapy to return to prior level of function.    Rehab Potential Good   Clinical Impairments Affecting Rehab Potential chronic ankle injury   PT Frequency 2x / week   PT Duration 8 weeks   PT Treatment/Interventions Cryotherapy;Moist Heat;Ultrasound;Therapeutic activities;Gait training;Therapeutic exercise;Balance training;Neuromuscular re-education;Passive  range of motion   PT Next Visit Plan YTB DF, BAPS board DF/PF    PT Home Exercise Plan YTB PF    Consulted and Agree with Plan of Care Patient      Patient will benefit from skilled therapeutic intervention in order to improve the following deficits and impairments:  Abnormal gait, Decreased balance, Decreased endurance, Decreased mobility, Difficulty walking, Decreased activity tolerance, Impaired flexibility, Pain, Decreased strength, Impaired sensation  Visit Diagnosis: Difficulty in walking, not elsewhere classified  Muscle weakness (generalized)     Problem List There are no active problems  to display for this patient.   Blythe Stanford, PT DPT 01/20/2016, 5:41 PM  [place] MAIN [facility] SERVICES [address] [place], [place], [address] Phone: [phone]   Fax:  [phone]  Name: DARION BREED MRN: [id number] Date of Birth: 1950-07-02

## 2016-01-25 ENCOUNTER — Encounter: Payer: BLUE CROSS/BLUE SHIELD | Admitting: Physical Therapy

## 2016-01-26 ENCOUNTER — Ambulatory Visit: Payer: Medicare Other | Attending: Podiatry

## 2016-01-26 DIAGNOSIS — R262 Difficulty in walking, not elsewhere classified: Secondary | ICD-10-CM | POA: Diagnosis not present

## 2016-01-26 DIAGNOSIS — M6281 Muscle weakness (generalized): Secondary | ICD-10-CM | POA: Diagnosis present

## 2016-01-26 NOTE — Therapy (Signed)
[place] MAIN [facility] SERVICES [address] [place], [place], [address] Phone: [phone]   Fax:  [phone]  Physical Therapy Treatment  Patient Details  Name: Dennis Lawrence MRN: [id number] Date of Birth: Feb 21, 1950 Referring Provider: Samara Deist  Encounter Date: 01/26/2016      PT End of Session - 01/26/16 0920    Visit Number 11   Number of Visits 17   Date for PT Re-Evaluation 01/31/16   Authorization Type blue cross blue shield   PT Start Time 0845   PT Stop Time 0930   PT Time Calculation (min) 45 min   Activity Tolerance Patient tolerated treatment well   Behavior During Therapy [facility] for tasks assessed/performed      Past Medical History:  Diagnosis Date  . Arthritis    HANDS  . Back pain    CHRONIC, DEGENERATION OF LUMBAR OR LUMBOSACRAL INTERVERTEBRAL DISC  . H/O seasonal allergies   . History of positive PPD    NORMAL CHEST XRAY 09/1998  . Hyperlipidemia   . Hypertension   . Neuromuscular disorder (HCC)    NEURALGIA AND NEURITIS  . Restless leg syndrome   . 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 ANKLE ARTHROSCOPY Right 11/11/2015   Procedure: ANKLE ARTHROSCOPY/OCD REPAIR;  Surgeon: Samara Deist, DPM;  Location: [place];  Service: Podiatry;  Laterality: Right;  POPLITEAL  . ANTERIOR TALOFIBULAR LIGAMENT REPAIR Right 11/11/2015   Procedure: ANKLE BROSTROM-GOULD PROCEDURE;  Surgeon: Samara Deist, DPM;  Location: [place];  Service: Podiatry;  Laterality: Right;  . BACK SURGERY     L5-S1 DISCECTOMY, NUMBNESS LEFT BIG TOE  . COLONOSCOPY    . FUNCTIONAL ENDOSCOPIC SINUS SURGERY    . TENDON REPAIR Right 11/11/2015   Procedure: FLEXOR TENDON REPAIR-SECOND, TENOLYSIS-SINGLE;  Surgeon: Samara Deist, DPM;  Location: [place];  Service: Podiatry;  Laterality: Right;    There were no  vitals filed for this visit.      Subjective Assessment - 01/26/16 0914    Subjective Pt reports minimal pain the MTP joint on the second digit of the affected foot. Patient reports he's been walking more without the boot at home.    Pertinent History Patient fell of a ladder 5 months ago and fractured his foot. His surgery was 11/03/15.    Limitations Standing;Walking   How long can you sit comfortably? siiting is not a problem   How long can you stand comfortably? 15 minutes   How long can you walk comfortably? 1-2 minutes   Patient Stated Goals to be able to ambulate without AD and go back to work.   Currently in Pain? Yes   Pain Score 2    Pain Location Ankle   Pain Orientation Right   Pain Descriptors / Indicators Aching   Pain Onset More than a month ago      TREATMENT Manual Therapy Performed STM to the dorsum and plantar aspect of the foot to alleviate inflammation and swelling in the foot/ankle. Grade III mobs to the TC and ST joint to alleviate foot/ankle pain. Grade III mobs to metatarsal digits I-V to decrease pain and spasms. Medial/lateral subtalar glides grade III-IV. MTP mobs grade III on digits II    Therapeutic Exercise Single leg stance in standing - 45sec x 2 Widened tandem ant/post weight shifting - x25 B Widened tandem ant/post  weight shifting with a step - x25 B Towel scrunches in sitting - 2 min Resisted toe flexion with yellow band - x20  Amb with focus on improving heel strike and push off - 71ft x 4 Heel raises in standing - x 15       PT Education - 01/26/16 0921    Education provided Yes   Education Details form/technique throughout session   Person(s) Educated Patient   Methods Explanation;Demonstration   Comprehension Verbalized understanding;Returned demonstration             PT Long Term Goals - 12/13/15 1459      PT LONG TERM GOAL #1   Title Patient will reduce timed up and go to <11 seconds to reduce fall risk and demonstrate  improved transfer/gait ability.   Time 8   Period Weeks   Status New     PT LONG TERM GOAL #2   Title Patient will increase 10 meter walk test to >1.51m/s as to improve gait speed for better community ambulation and to reduce fall risk   Time 8   Period Weeks   Status New     PT LONG TERM GOAL #3   Title Patient will increase R ankle  gross strength to 4+/5 as to improve functional strength for independent gait, increased standing tolerance and increased ADL ability.   Baseline 1/5   Time 8   Period Weeks   Status New     PT LONG TERM GOAL #4   Title Patient will report a worst pain of 3/10 on VAS in right ankle to improve tolerance with ADLs and reduced symptoms with activities.    Baseline 5/10   Time 8   Period Weeks   Status New     PT LONG TERM GOAL #5   Title Patient will increase six minute walk test distance to >1000 for progression to community ambulator and improve gait ability   Time 8   Period Weeks   Status New     Additional Long Term Goals   Additional Long Term Goals Yes               Plan - 01/26/16 0932    Clinical Impression Statement Patient demonstrates improved ambulation today with increased weight shifting onto R LE and step length bilaterally. Focused on improving  toe flexion ROM and improving gait pattern. Patient will beneefit from further skilled therapy focused on improving limitations in muscular coordination and strength to improve walking ability.    Rehab Potential Good   Clinical Impairments Affecting Rehab Potential chronic ankle injury   PT Frequency 2x / week   PT Duration 8 weeks   PT Treatment/Interventions Cryotherapy;Moist Heat;Ultrasound;Therapeutic activities;Gait training;Therapeutic exercise;Balance training;Neuromuscular re-education;Passive range of motion   PT Next Visit Plan YTB DF, BAPS board DF/PF    PT Home Exercise Plan YTB PF    Consulted and Agree with Plan of Care Patient      Patient will benefit from  skilled therapeutic intervention in order to improve the following deficits and impairments:  Abnormal gait, Decreased balance, Decreased endurance, Decreased mobility, Difficulty walking, Decreased activity tolerance, Impaired flexibility, Pain, Decreased strength, Impaired sensation  Visit Diagnosis: Difficulty in walking, not elsewhere classified  Muscle weakness (generalized)     Problem List There are no active problems to display for this patient.   Blythe Stanford, PT DPT 01/26/2016, 10:05 AM  [place] MAIN [facility] SERVICES [place], [place],  [place] Phone: [phone]   Fax:  [phone]  Name: Dennis Lawrence MRN: [id number] Date of Birth: September 29, 1950

## 2016-01-27 ENCOUNTER — Encounter: Payer: BLUE CROSS/BLUE SHIELD | Admitting: Physical Therapy

## 2016-02-01 ENCOUNTER — Encounter: Payer: BLUE CROSS/BLUE SHIELD | Admitting: Physical Therapy

## 2016-02-03 ENCOUNTER — Encounter: Payer: BLUE CROSS/BLUE SHIELD | Admitting: Physical Therapy

## 2016-02-08 ENCOUNTER — Encounter: Payer: BLUE CROSS/BLUE SHIELD | Admitting: Physical Therapy

## 2016-02-10 ENCOUNTER — Encounter: Payer: BLUE CROSS/BLUE SHIELD | Admitting: Physical Therapy

## 2016-02-15 ENCOUNTER — Ambulatory Visit: Payer: Medicare Other

## 2016-02-15 DIAGNOSIS — R262 Difficulty in walking, not elsewhere classified: Secondary | ICD-10-CM

## 2016-02-15 DIAGNOSIS — M6281 Muscle weakness (generalized): Secondary | ICD-10-CM

## 2016-02-15 NOTE — Therapy (Signed)
[place] MAIN [facility] SERVICES [address] [place], [place], [address] Phone: [phone]   Fax:  [phone]  Physical Therapy Treatment  Patient Details  Name: Dennis Lawrence MRN: [id number] Date of Birth: 09-Feb-1950 Referring Provider: Samara Deist  Encounter Date: 02/15/2016      PT End of Session - 02/15/16 0828    Visit Number 12   Number of Visits 29   Date for PT Re-Evaluation 03/28/16   Authorization Type 1/10 G Code   PT Start Time 0800   PT Stop Time 0845   PT Time Calculation (min) 45 min   Activity Tolerance Patient tolerated treatment well   Behavior During Therapy [facility] for tasks assessed/performed      Past Medical History:  Diagnosis Date  . Arthritis    HANDS  . Back pain    CHRONIC, DEGENERATION OF LUMBAR OR LUMBOSACRAL INTERVERTEBRAL DISC  . H/O seasonal allergies   . History of positive PPD    NORMAL CHEST XRAY 09/1998  . Hyperlipidemia   . Hypertension   . Neuromuscular disorder (HCC)    NEURALGIA AND NEURITIS  . Restless leg syndrome   . 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 ANKLE ARTHROSCOPY Right 11/11/2015   Procedure: ANKLE ARTHROSCOPY/OCD REPAIR;  Surgeon: Samara Deist, DPM;  Location: [place];  Service: Podiatry;  Laterality: Right;  POPLITEAL  . ANTERIOR TALOFIBULAR LIGAMENT REPAIR Right 11/11/2015   Procedure: ANKLE BROSTROM-GOULD PROCEDURE;  Surgeon: Samara Deist, DPM;  Location: [place];  Service: Podiatry;  Laterality: Right;  . BACK SURGERY     L5-S1 DISCECTOMY, NUMBNESS LEFT BIG TOE  . COLONOSCOPY    . FUNCTIONAL ENDOSCOPIC SINUS SURGERY    . TENDON REPAIR Right 11/11/2015   Procedure: FLEXOR TENDON REPAIR-SECOND, TENOLYSIS-SINGLE;  Surgeon: Samara Deist, DPM;  Location: [place];  Service: Podiatry;  Laterality: Right;    There were no vitals filed  for this visit.      Subjective Assessment - 02/15/16 0817    Subjective Pt reeports he conitnues to have discomfort in his ankle when walking. Reports decreased overall pain since leaving on vacation.    Pertinent History Patient fell of a ladder 5 months ago and fractured his foot. His surgery was 11/03/15.    Limitations Standing;Walking   How long can you sit comfortably? siiting is not a problem   How long can you stand comfortably? 15 minutes   How long can you walk comfortably? 1-2 minutes   Patient Stated Goals to be able to ambulate without AD and go back to work.   Currently in Pain? Yes   Pain Score 1    Pain Location Ankle   Pain Orientation Right   Pain Descriptors / Indicators Aching   Pain Type Chronic pain   Pain Onset More than a month ago   Pain Frequency Intermittent      TREATMENT: Single leg stance - 2 x 1 min Single leg stance rotations - x15 with intermittent UE support Hip abduction in standing - 2 x 20 with UE support Heel raises with UE support - 2 x 20 Ambulation for 1252ft with focus improving heel strike and push off throughout gait cycle. Patient demonstrates antalgic gait with decreased stance time on the R LE versus the L LE.  Observation: R Ankle Dorsiflexion: 4-/5, plantarflexion: 3+/5 inversion: 4/5, eversion  4/5 TUG: 9.85 24mWT: 7.39sec 78minWT: 1373ft      PT Education - 02/15/16 0824    Education provided Yes   Education Details Educated on HEP; form and technique   Person(s) Educated Patient   Methods Explanation;Demonstration   Comprehension Verbalized understanding;Returned demonstration             PT Long Term Goals - 02/15/16 [id number]      PT LONG TERM GOAL #1   Title Patient will reduce timed up and go to <11 seconds to reduce fall risk and demonstrate improved transfer/gait ability.   Baseline 02/15/16: 9.85sec   Time 8   Period Weeks   Status Achieved     PT LONG TERM GOAL #2   Title Patient will increase 10 meter  walk test to >1.40m/s as to improve gait speed for better community ambulation and to reduce fall risk   Baseline 02/15/16: 1.34 m/sec   Time 8   Period Weeks   Status Achieved     PT LONG TERM GOAL #3   Title Patient will increase R ankle  gross strength to 4+/5 as to improve functional strength for independent gait, increased standing tolerance and increased ADL ability.   Baseline 02/15/16: 3+ to 4+/5   Time 8   Period Weeks   Status On-going     PT LONG TERM GOAL #4   Title Patient will report a worst pain of 3/10 on VAS in right ankle to improve tolerance with ADLs and reduced symptoms with activities.    Baseline 5/10 02/15/16: Worst pain 3/10 in past week   Time 8   Period Weeks   Status Achieved     PT LONG TERM GOAL #5   Title Patient will increase six minute walk test distance to >1000 for progression to community ambulator and improve gait ability   Baseline 02/15/16: 1365sec   Time 8   Period Weeks   Status Achieved     Additional Long Term Goals   Additional Long Term Goals Yes     PT LONG TERM GOAL #6   Title Patient will demonstrate independence with HEP to continue benefits from therapy after discharge.    Baseline 02/15/16: Requires frequent cueing on body positioning and technqiue with exercise   Time 8   Period Weeks   Status New     PT LONG TERM GOAL #7   Title Patient will be able to perform single leg stance on R LE for >10sec to demonstrates improved static balance and ability to donn pants more functionally.    Baseline Single leg stance on  R LE : 3 sec   Time 8   Period Weeks   Status New               Plan - 02/15/16 1249    Clinical Impression Statement Patient demonstrates improved TUG, 26mWT, and 62minWT indicating improved balance and decreased fall risk. Although patient is improving, he continues to demonstrate decreased sigle leg stance time inhibiting proper coordination when performing occupational tasks such as ladder climbing and  functional tasks such as donning/doffing his pants. Patient will benefit from further skilled therapy focused on improving static/dynamic balance and consolidating HEP to decrease fall risk and return to prior level of function.    Rehab Potential Good   Clinical Impairments Affecting Rehab Potential chronic ankle injury   PT Frequency 2x / week   PT Duration 8 weeks   PT Treatment/Interventions Cryotherapy;Moist Heat;Ultrasound;Therapeutic activities;Gait training;Therapeutic exercise;Balance training;Neuromuscular  re-education;Passive range of motion   PT Next Visit Plan YTB DF, BAPS board DF/PF    PT Home Exercise Plan YTB PF    Consulted and Agree with Plan of Care Patient      Patient will benefit from skilled therapeutic intervention in order to improve the following deficits and impairments:  Abnormal gait, Decreased balance, Decreased endurance, Decreased mobility, Difficulty walking, Decreased activity tolerance, Impaired flexibility, Pain, Decreased strength, Impaired sensation  Visit Diagnosis: Difficulty in walking, not elsewhere classified - Plan: PT plan of care cert/re-cert  Muscle weakness (generalized) - Plan: PT plan of care cert/re-cert       G-Codes - Mar 11, 2016 0901    Functional Assessment Tool Used Single leg stance time, MMT, AROM, gait assessment, clinical judgement   Functional Limitation Mobility: Walking and moving around   Mobility: Walking and Moving Around Current Status [id number]) At least 1 percent but less than 20 percent impaired, limited or restricted   Mobility: Walking and Moving Around Goal Status [id number]) At least 1 percent but less than 20 percent impaired, limited or restricted      Problem List There are no active problems to display for this patient.   Blythe Stanford, PT DPT 03-11-16, 12:58 PM  [place] MAIN [facility] SERVICES [address] [place], [place], [address] Phone: [phone]   Fax:   [phone]  Name: Dennis Lawrence MRN: [id number] Date of Birth: Sep 01, 1950

## 2016-02-17 ENCOUNTER — Ambulatory Visit: Payer: Medicare Other

## 2016-02-22 ENCOUNTER — Ambulatory Visit: Payer: Medicare Other

## 2016-02-22 DIAGNOSIS — R262 Difficulty in walking, not elsewhere classified: Secondary | ICD-10-CM

## 2016-02-22 DIAGNOSIS — M6281 Muscle weakness (generalized): Secondary | ICD-10-CM

## 2016-02-22 NOTE — Therapy (Signed)
[place] MAIN [facility] SERVICES [address] [place], [place], [address] Phone: [phone]   Fax:  [phone]  Physical Therapy Treatment  Patient Details  Name: Dennis Lawrence MRN: [id number] Date of Birth: [birth year] Referring Provider: Samara Deist  Encounter Date: 02/22/2016      PT End of Session - 02/22/16 0856    Visit Number 13   Number of Visits 29   Date for PT Re-Evaluation 03/28/16   Authorization Type 2/10 G Code   PT Start Time 0802   PT Stop Time 0846   PT Time Calculation (min) 44 min   Activity Tolerance Patient tolerated treatment well   Behavior During Therapy [facility] for tasks assessed/performed      Past Medical History:  Diagnosis Date  . Arthritis    HANDS  . Back pain    CHRONIC, DEGENERATION OF LUMBAR OR LUMBOSACRAL INTERVERTEBRAL DISC  . H/O seasonal allergies   . History of positive PPD    NORMAL CHEST XRAY 09/1998  . Hyperlipidemia   . Hypertension   . Neuromuscular disorder (HCC)    NEURALGIA AND NEURITIS  . Restless leg syndrome   . 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 ANKLE ARTHROSCOPY Right 11/11/2015   Procedure: ANKLE ARTHROSCOPY/OCD REPAIR;  Surgeon: Samara Deist, DPM;  Location: [place];  Service: Podiatry;  Laterality: Right;  POPLITEAL  . ANTERIOR TALOFIBULAR LIGAMENT REPAIR Right 11/11/2015   Procedure: ANKLE BROSTROM-GOULD PROCEDURE;  Surgeon: Samara Deist, DPM;  Location: [place];  Service: Podiatry;  Laterality: Right;  . BACK SURGERY     L5-S1 DISCECTOMY, NUMBNESS LEFT BIG TOE  . COLONOSCOPY    . FUNCTIONAL ENDOSCOPIC SINUS SURGERY    . TENDON REPAIR Right 11/11/2015   Procedure: FLEXOR TENDON REPAIR-SECOND, TENOLYSIS-SINGLE;  Surgeon: Samara Deist, DPM;  Location: [place];  Service: Podiatry;  Laterality: Right;    There were no vitals filed  for this visit.      Subjective Assessment - 02/22/16 0836    Subjective Patient reports no pain currently but states increased stiffness along the metatarsal-phalange joint.    Pertinent History Patient fell of a ladder 5 months ago and fractured his foot. His surgery was 11/03/15.    Limitations Standing;Walking   How long can you sit comfortably? siiting is not a problem   How long can you stand comfortably? 15 minutes   How long can you walk comfortably? 1-2 minutes   Patient Stated Goals to be able to ambulate without AD and go back to work.   Currently in Pain? No/denies   Pain Onset More than a month ago      TREATMENT: Manual Therapy: STM performed throughout the plantar fascia with the pt in long sitting; Performed Talocrual, subtalar distraction and A->P mobilizations for 5 x 30secs. Performed intermetatarsal mobilizations and MTP joint mobilizations throughout all MTP joints on the R L --  3 x 30sec.  Therapeutic Exercise: Single leg stance on airex  - 3 x 1 min Heel raises with UE support -  x 20 BAPS level 3 single leg stance in standing - forward/backward; left/right - 1 min each direction Side stepping on Bosu - x15 up and over Ambulation for 586ft with focus improving heel strike and push off throughout gait cycle. Patient demonstrates antalgic gait with decreased stance time on the R LE versus  the L LE.      PT Education - 02/22/16 0855    Education provided Yes   Education Details Pain physiology and form.technique with exercise   Person(s) Educated Patient   Methods Explanation;Demonstration   Comprehension Verbalized understanding;Returned demonstration             PT Long Term Goals - 02/15/16 [id number]      PT LONG TERM GOAL #1   Title Patient will reduce timed up and go to <11 seconds to reduce fall risk and demonstrate improved transfer/gait ability.   Baseline 02/15/16: 9.85sec   Time 8   Period Weeks   Status Achieved     PT LONG TERM GOAL #2    Title Patient will increase 10 meter walk test to >1.51m/s as to improve gait speed for better community ambulation and to reduce fall risk   Baseline 02/15/16: 1.34 m/sec   Time 8   Period Weeks   Status Achieved     PT LONG TERM GOAL #3   Title Patient will increase R ankle  gross strength to 4+/5 as to improve functional strength for independent gait, increased standing tolerance and increased ADL ability.   Baseline 02/15/16: 3+ to 4+/5   Time 8   Period Weeks   Status On-going     PT LONG TERM GOAL #4   Title Patient will report a worst pain of 3/10 on VAS in right ankle to improve tolerance with ADLs and reduced symptoms with activities.    Baseline 5/10 02/15/16: Worst pain 3/10 in past week   Time 8   Period Weeks   Status Achieved     PT LONG TERM GOAL #5   Title Patient will increase six minute walk test distance to >1000 for progression to community ambulator and improve gait ability   Baseline 02/15/16: 1365sec   Time 8   Period Weeks   Status Achieved     Additional Long Term Goals   Additional Long Term Goals Yes     PT LONG TERM GOAL #6   Title Patient will demonstrate independence with HEP to continue benefits from therapy after discharge.    Baseline 02/15/16: Requires frequent cueing on body positioning and technqiue with exercise   Time 8   Period Weeks   Status New     PT LONG TERM GOAL #7   Title Patient will be able to perform single leg stance on R LE for >10sec to demonstrates improved static balance and ability to donn pants more functionally.    Baseline Single leg stance on  R LE : 3 sec   Time 8   Period Weeks   Status New               Plan - 02/22/16 [id number]    Clinical Impression Statement Patient demonstrates improved movement and decreased pain when ambulating indicating improved AROM and motor control. Patient although patient is improving, he conitnues to demonstrate antalgic gait when ambulating and will benefit from further skilled  therapy to return to prior level of function.    Rehab Potential Good   Clinical Impairments Affecting Rehab Potential chronic ankle injury   PT Frequency 2x / week   PT Duration 8 weeks   PT Treatment/Interventions Cryotherapy;Moist Heat;Ultrasound;Therapeutic activities;Gait training;Therapeutic exercise;Balance training;Neuromuscular re-education;Passive range of motion   PT Next Visit Plan YTB DF, BAPS board DF/PF    PT Home Exercise Plan YTB PF    Consulted and Agree with Plan of Care  Patient      Patient will benefit from skilled therapeutic intervention in order to improve the following deficits and impairments:  Abnormal gait, Decreased balance, Decreased endurance, Decreased mobility, Difficulty walking, Decreased activity tolerance, Impaired flexibility, Pain, Decreased strength, Impaired sensation  Visit Diagnosis: Difficulty in walking, not elsewhere classified  Muscle weakness (generalized)     Problem List There are no active problems to display for this patient.   Blythe Stanford, PT DPT 02/22/2016, 9:07 AM  [place] MAIN [facility] SERVICES [address] [place], [place], [address] Phone: [phone]   Fax:  [phone]  Name: NIXXON GILSTRAP MRN: [id number] Date of Birth: [birth year]

## 2016-02-24 ENCOUNTER — Ambulatory Visit: Payer: Medicare Other | Attending: Podiatry

## 2016-02-24 DIAGNOSIS — M6281 Muscle weakness (generalized): Secondary | ICD-10-CM | POA: Insufficient documentation

## 2016-02-24 DIAGNOSIS — R262 Difficulty in walking, not elsewhere classified: Secondary | ICD-10-CM | POA: Diagnosis present

## 2016-02-24 NOTE — Therapy (Signed)
[place] MAIN [facility] SERVICES [address] [place], [place], [address] Phone: [phone]   Fax:  [phone]  Physical Therapy Treatment  Patient Details  Name: Dennis Lawrence MRN: [id number] Date of Birth: [birth year] Referring Provider: Samara Deist  Encounter Date: 02/24/2016      PT End of Session - 02/24/16 0841    Visit Number 14   Number of Visits 29   Date for PT Re-Evaluation 03/28/16   Authorization Type 3/10 G Code   PT Start Time 0802   PT Stop Time 0845   PT Time Calculation (min) 43 min   Activity Tolerance Patient tolerated treatment well   Behavior During Therapy [facility] for tasks assessed/performed      Past Medical History:  Diagnosis Date  . Arthritis    HANDS  . Back pain    CHRONIC, DEGENERATION OF LUMBAR OR LUMBOSACRAL INTERVERTEBRAL DISC  . H/O seasonal allergies   . History of positive PPD    NORMAL CHEST XRAY 09/1998  . Hyperlipidemia   . Hypertension   . Neuromuscular disorder (HCC)    NEURALGIA AND NEURITIS  . Restless leg syndrome   . 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 ANKLE ARTHROSCOPY Right 11/11/2015   Procedure: ANKLE ARTHROSCOPY/OCD REPAIR;  Surgeon: Samara Deist, DPM;  Location: [place];  Service: Podiatry;  Laterality: Right;  POPLITEAL  . ANTERIOR TALOFIBULAR LIGAMENT REPAIR Right 11/11/2015   Procedure: ANKLE BROSTROM-GOULD PROCEDURE;  Surgeon: Samara Deist, DPM;  Location: [place];  Service: Podiatry;  Laterality: Right;  . BACK SURGERY     L5-S1 DISCECTOMY, NUMBNESS LEFT BIG TOE  . COLONOSCOPY    . FUNCTIONAL ENDOSCOPIC SINUS SURGERY    . TENDON REPAIR Right 11/11/2015   Procedure: FLEXOR TENDON REPAIR-SECOND, TENOLYSIS-SINGLE;  Surgeon: Samara Deist, DPM;  Location: [place];  Service: Podiatry;  Laterality: Right;    There were no vitals filed  for this visit.      Subjective Assessment - 02/24/16 0835    Subjective Patient reports minor pain in the MTP joints and the along the plantar fascia on the R LE   Pertinent History Patient fell of a ladder 5 months ago and fractured his foot. His surgery was 11/03/15.    Limitations Standing;Walking   How long can you sit comfortably? siiting is not a problem   How long can you stand comfortably? 15 minutes   How long can you walk comfortably? 1-2 minutes   Patient Stated Goals to be able to ambulate without AD and go back to work.   Currently in Pain? No/denies   Pain Onset More than a month ago        TREATMENT: Manual Therapy: STM performed throughout the plantar fascia with the pt in long sitting; Performed Talocrual, subtalar distraction and A->P mobilizations for 5 x 30secs. Performed intermetatarsal mobilizations and MTP joint mobilizations throughout all MTP joints on the R L --  3 x 30sec.  Therapeutic Exercise: Single leg stance on Bosu on black side and blue side  - 3 x 1 min Hip abduction off of Bosu - 2 x 20 with UE  support BAPS level 2 single leg stance in standing - forward/backward; left/right, rotations CW/CCW - 1 min each direction Side stepping on Bosu - x15 up and over Marches on blue side of bosu with minimal UE  support - x20 Ambulation for 523ft with focus improving heel strike and push off throughout gait cycle. Patient demonstrates antalgic gait with decreased stance time on the R LE versus the L LE.        PT Education - 02/24/16 [phone]    Education provided Yes   Education Details form/technique with exercise   Person(s) Educated Patient   Methods Explanation;Demonstration   Comprehension Verbalized understanding;Returned demonstration             PT Long Term Goals - 02/15/16 [id number]      PT LONG TERM GOAL #1   Title Patient will reduce timed up and go to <11 seconds to reduce fall risk and demonstrate improved transfer/gait ability.    Baseline 02/15/16: 9.85sec   Time 8   Period Weeks   Status Achieved     PT LONG TERM GOAL #2   Title Patient will increase 10 meter walk test to >1.57m/s as to improve gait speed for better community ambulation and to reduce fall risk   Baseline 02/15/16: 1.34 m/sec   Time 8   Period Weeks   Status Achieved     PT LONG TERM GOAL #3   Title Patient will increase R ankle  gross strength to 4+/5 as to improve functional strength for independent gait, increased standing tolerance and increased ADL ability.   Baseline 02/15/16: 3+ to 4+/5   Time 8   Period Weeks   Status On-going     PT LONG TERM GOAL #4   Title Patient will report a worst pain of 3/10 on VAS in right ankle to improve tolerance with ADLs and reduced symptoms with activities.    Baseline 5/10 02/15/16: Worst pain 3/10 in past week   Time 8   Period Weeks   Status Achieved     PT LONG TERM GOAL #5   Title Patient will increase six minute walk test distance to >1000 for progression to community ambulator and improve gait ability   Baseline 02/15/16: 1365sec   Time 8   Period Weeks   Status Achieved     Additional Long Term Goals   Additional Long Term Goals Yes     PT LONG TERM GOAL #6   Title Patient will demonstrate independence with HEP to continue benefits from therapy after discharge.    Baseline 02/15/16: Requires frequent cueing on body positioning and technqiue with exercise   Time 8   Period Weeks   Status New     PT LONG TERM GOAL #7   Title Patient will be able to perform single leg stance on R LE for >10sec to demonstrates improved static balance and ability to donn pants more functionally.    Baseline Single leg stance on  R LE : 3 sec   Time 8   Period Weeks   Status New               Plan - 02/24/16 [id number]    Clinical Impression Statement Patient demonstrates improved pain and spasms after performing manual therapy indicating decreased pain and spasms. Patient demonstrated increased fatigue  when performing hip stabilizaiton exercises indicating decreased strength/endurance patient will benefit from further skilled therapy to return to prior level of function.   Rehab Potential Good   Clinical Impairments Affecting Rehab Potential chronic ankle injury   PT Frequency 2x / week   PT Duration 8 weeks   PT Treatment/Interventions Cryotherapy;Moist Heat;Ultrasound;Therapeutic activities;Gait training;Therapeutic exercise;Balance training;Neuromuscular re-education;Passive range of motion   PT  Next Visit Plan YTB DF, BAPS board DF/PF    PT Home Exercise Plan YTB PF    Consulted and Agree with Plan of Care Patient      Patient will benefit from skilled therapeutic intervention in order to improve the following deficits and impairments:  Abnormal gait, Decreased balance, Decreased endurance, Decreased mobility, Difficulty walking, Decreased activity tolerance, Impaired flexibility, Pain, Decreased strength, Impaired sensation  Visit Diagnosis: Difficulty in walking, not elsewhere classified  Muscle weakness (generalized)     Problem List There are no active problems to display for this patient.   Blythe Stanford, PT DPT 02/24/2016, 9:37 AM  [place] MAIN [facility] SERVICES [address] [place], [place], [address] Phone: [phone]   Fax:  [phone]  Name: Dennis Lawrence MRN: [id number] Date of Birth: 07-02-1950

## 2016-02-29 ENCOUNTER — Ambulatory Visit: Payer: Medicare Other

## 2016-03-02 ENCOUNTER — Ambulatory Visit: Payer: Medicare Other

## 2016-03-02 DIAGNOSIS — M6281 Muscle weakness (generalized): Secondary | ICD-10-CM

## 2016-03-02 DIAGNOSIS — R262 Difficulty in walking, not elsewhere classified: Secondary | ICD-10-CM | POA: Diagnosis not present

## 2016-03-02 NOTE — Therapy (Signed)
[place] MAIN [facility] SERVICES [address] [place], [place], [address] Phone: [phone]   Fax:  [phone]  Physical Therapy Treatment  Patient Details  Name: Dennis Lawrence MRN: [id number] Date of Birth: May 31, 1950 Referring Provider: Samara Deist  Encounter Date: 03/02/2016      PT End of Session - 03/02/16 0854    Visit Number 15   Number of Visits 29   Date for PT Re-Evaluation 03/28/16   Authorization Type 4/10 G Code   PT Start Time 0800   PT Stop Time 0843   PT Time Calculation (min) 43 min   Activity Tolerance Patient tolerated treatment well   Behavior During Therapy [facility] for tasks assessed/performed      Past Medical History:  Diagnosis Date  . Arthritis    HANDS  . Back pain    CHRONIC, DEGENERATION OF LUMBAR OR LUMBOSACRAL INTERVERTEBRAL DISC  . H/O seasonal allergies   . History of positive PPD    NORMAL CHEST XRAY 09/1998  . Hyperlipidemia   . Hypertension   . Neuromuscular disorder (HCC)    NEURALGIA AND NEURITIS  . Restless leg syndrome   . 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 ANKLE ARTHROSCOPY Right 11/11/2015   Procedure: ANKLE ARTHROSCOPY/OCD REPAIR;  Surgeon: Samara Deist, DPM;  Location: [place];  Service: Podiatry;  Laterality: Right;  POPLITEAL  . ANTERIOR TALOFIBULAR LIGAMENT REPAIR Right 11/11/2015   Procedure: ANKLE BROSTROM-GOULD PROCEDURE;  Surgeon: Samara Deist, DPM;  Location: [place];  Service: Podiatry;  Laterality: Right;  . BACK SURGERY     L5-S1 DISCECTOMY, NUMBNESS LEFT BIG TOE  . COLONOSCOPY    . FUNCTIONAL ENDOSCOPIC SINUS SURGERY    . TENDON REPAIR Right 11/11/2015   Procedure: FLEXOR TENDON REPAIR-SECOND, TENOLYSIS-SINGLE;  Surgeon: Samara Deist, DPM;  Location: [place];  Service: Podiatry;  Laterality: Right;    There were no vitals filed  for this visit.      Subjective Assessment - 03/02/16 0842    Subjective Patient reports he only has pain in the ankle when performing toe off while walking.    Pertinent History Patient fell of a ladder 5 months ago and fractured his foot. His surgery was 11/03/15.    Limitations Standing;Walking   How long can you sit comfortably? siiting is not a problem   How long can you stand comfortably? 15 minutes   How long can you walk comfortably? 1-2 minutes   Patient Stated Goals to be able to ambulate without AD and go back to work.   Currently in Pain? No/denies   Pain Onset More than a month ago       TREATMENT: Manual Therapy: STM performed throughout the plantar fascia with the pt in long sitting; Performed Talocrual, subtalar distraction and A->P mobilizations for 5 x 30secs. Performed intermetatarsal mobilizations and MTP joint mobilizations throughout all MTP joints on the R L --  3 x 30sec.  A->P grade IV mobilizations in standing when translating the knee forward on a step - 3 x 30sec  Therapeutic Exercise: Closed kinetic chain knee translation for ankle mobilization - x25 with one sec hold Pre gait with weight shifting with stress on push off with R foot behind - x 25 Manual resisted toe flexion in supine on R LE - x 10; toe flexion with GTB - x 10  Heel raises in standing on R LE only - x 25 and intermittent UE support Ambulation for 273ft with focus improving heel strike and push off throughout gait cycle. Patient demonstrates antalgic gait with decreased stance time on the R LE versus the L LE.       PT Education - 03/02/16 0853    Education provided Yes   Education Details HEP: closed kinetic chain, single leg heel raises, toe flexion with band   Person(s) Educated Patient   Methods Explanation;Demonstration   Comprehension Verbalized understanding;Returned demonstration             PT Long Term Goals - 02/15/16 [id number]      PT LONG TERM GOAL #1   Title Patient  will reduce timed up and go to <11 seconds to reduce fall risk and demonstrate improved transfer/gait ability.   Baseline 02/15/16: 9.85sec   Time 8   Period Weeks   Status Achieved     PT LONG TERM GOAL #2   Title Patient will increase 10 meter walk test to >1.82m/s as to improve gait speed for better community ambulation and to reduce fall risk   Baseline 02/15/16: 1.34 m/sec   Time 8   Period Weeks   Status Achieved     PT LONG TERM GOAL #3   Title Patient will increase R ankle  gross strength to 4+/5 as to improve functional strength for independent gait, increased standing tolerance and increased ADL ability.   Baseline 02/15/16: 3+ to 4+/5   Time 8   Period Weeks   Status On-going     PT LONG TERM GOAL #4   Title Patient will report a worst pain of 3/10 on VAS in right ankle to improve tolerance with ADLs and reduced symptoms with activities.    Baseline 5/10 02/15/16: Worst pain 3/10 in past week   Time 8   Period Weeks   Status Achieved     PT LONG TERM GOAL #5   Title Patient will increase six minute walk test distance to >1000 for progression to community ambulator and improve gait ability   Baseline 02/15/16: 1365sec   Time 8   Period Weeks   Status Achieved     Additional Long Term Goals   Additional Long Term Goals Yes     PT LONG TERM GOAL #6   Title Patient will demonstrate independence with HEP to continue benefits from therapy after discharge.    Baseline 02/15/16: Requires frequent cueing on body positioning and technqiue with exercise   Time 8   Period Weeks   Status New     PT LONG TERM GOAL #7   Title Patient will be able to perform single leg stance on R LE for >10sec to demonstrates improved static balance and ability to donn pants more functionally.    Baseline Single leg stance on  R LE : 3 sec   Time 8   Period Weeks   Status New               Plan - 03/02/16 [id number]    Clinical Impression Statement Patient demonstrates increased pain  during push off portion of gait indicating decreased toe flexion/plantarflexion strength. Patient demonstrates improved movement with gait cycle after performing manual therapy indicating improve ROM. Patient will benefit from further skilled therapy to return to proir level of function.    Rehab Potential Good   Clinical Impairments Affecting Rehab Potential chronic ankle injury   PT Frequency 2x / week  PT Duration 8 weeks   PT Treatment/Interventions Cryotherapy;Moist Heat;Ultrasound;Therapeutic activities;Gait training;Therapeutic exercise;Balance training;Neuromuscular re-education;Passive range of motion   PT Next Visit Plan YTB DF, BAPS board DF/PF    PT Home Exercise Plan YTB PF    Consulted and Agree with Plan of Care Patient      Patient will benefit from skilled therapeutic intervention in order to improve the following deficits and impairments:  Abnormal gait, Decreased balance, Decreased endurance, Decreased mobility, Difficulty walking, Decreased activity tolerance, Impaired flexibility, Pain, Decreased strength, Impaired sensation  Visit Diagnosis: Difficulty in walking, not elsewhere classified  Muscle weakness (generalized)     Problem List There are no active problems to display for this patient.   Blythe Stanford, PT DPT 03/02/2016, 9:10 AM  [place] MAIN [facility] SERVICES [address] [place], [place], [address] Phone: [phone]   Fax:  [phone]  Name: Dennis Lawrence MRN: [id number] Date of Birth: Jul 20, 1950

## 2016-03-07 ENCOUNTER — Ambulatory Visit: Payer: Medicare Other

## 2016-03-07 DIAGNOSIS — M6281 Muscle weakness (generalized): Secondary | ICD-10-CM

## 2016-03-07 DIAGNOSIS — R262 Difficulty in walking, not elsewhere classified: Secondary | ICD-10-CM | POA: Diagnosis not present

## 2016-03-07 NOTE — Therapy (Signed)
[place] MAIN [facility] SERVICES [address] [place], [place], [address] Phone: [phone]   Fax:  [phone]  Physical Therapy Treatment  Patient Details  Name: Dennis Lawrence MRN: [id number] Date of Birth: Jan 27, 1950 Referring Provider: Samara Deist  Encounter Date: 03/07/2016      PT End of Session - 03/07/16 1729    Visit Number 16   Number of Visits 29   Date for PT Re-Evaluation 03/28/16   Authorization Type 5/10 G Code   PT Start Time 1645   PT Stop Time 1730   PT Time Calculation (min) 45 min   Activity Tolerance Patient tolerated treatment well   Behavior During Therapy [facility] for tasks assessed/performed      Past Medical History:  Diagnosis Date  . Arthritis    HANDS  . Back pain    CHRONIC, DEGENERATION OF LUMBAR OR LUMBOSACRAL INTERVERTEBRAL DISC  . H/O seasonal allergies   . History of positive PPD    NORMAL CHEST XRAY 09/1998  . Hyperlipidemia   . Hypertension   . Neuromuscular disorder (HCC)    NEURALGIA AND NEURITIS  . Restless leg syndrome   . 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 ANKLE ARTHROSCOPY Right 11/11/2015   Procedure: ANKLE ARTHROSCOPY/OCD REPAIR;  Surgeon: Samara Deist, DPM;  Location: [place];  Service: Podiatry;  Laterality: Right;  POPLITEAL  . ANTERIOR TALOFIBULAR LIGAMENT REPAIR Right 11/11/2015   Procedure: ANKLE BROSTROM-GOULD PROCEDURE;  Surgeon: Samara Deist, DPM;  Location: [place];  Service: Podiatry;  Laterality: Right;  . BACK SURGERY     L5-S1 DISCECTOMY, NUMBNESS LEFT BIG TOE  . COLONOSCOPY    . FUNCTIONAL ENDOSCOPIC SINUS SURGERY    . TENDON REPAIR Right 11/11/2015   Procedure: FLEXOR TENDON REPAIR-SECOND, TENOLYSIS-SINGLE;  Surgeon: Samara Deist, DPM;  Location: [place];  Service: Podiatry;  Laterality: Right;    There were no vitals filed  for this visit.      Subjective Assessment - 03/07/16 1725    Subjective Patient continues to report increased pain in the ankle during toe off portion of gait    Pertinent History Patient fell of a ladder 5 months ago and fractured his foot. His surgery was 11/03/15.    Limitations Standing;Walking   How long can you sit comfortably? siiting is not a problem   How long can you stand comfortably? 15 minutes   How long can you walk comfortably? 1-2 minutes   Patient Stated Goals to be able to ambulate without AD and go back to work.   Currently in Pain? No/denies   Pain Onset More than a month ago         TREATMENT: Manual Therapy: STM performed throughout the plantar fascia with the pt in long sitting; Performed Talocrual, subtalar distraction and A->P mobilizations for 5 x 30secs. Performed intermetatarsal mobilizations and MTP joint mobilizations throughout all MTP joints on the R L --  3 x 30sec.  A->P grade IV mobilizations in standing when translating the knee forward on a step - 3 x 30sec.   Therapeutic Exercise: Heel raises in standing on R LE only - x 25 and intermittent UE support Dynadisc ankle stabilization - ant/post, laterally, circles cw/ccw - 2 min each direction on SLS Leg Press at quantum - 2 x 20  for quadriceps; 2 x 20 for gastrocs  Manual resisted toe flexion in supine on R LE - x 10 with 5 sec holds Lunges with minimal UE support in standing - x 10 B cueing on joint positioning required throughout movement. Standing lunge stretch - 30 sec hold x 3  Ambulation for 138ft with focus improving heel strike and push off throughout gait cycle. Patient demonstrates antalgic gait with decreased stance time on the R LE versus the L LE.       PT Education - 03/07/16 1728    Education provided Yes   Education Details Form/technique with exercise performance   Person(s) Educated Patient   Methods Explanation;Demonstration   Comprehension Verbalized  understanding;Returned demonstration             PT Long Term Goals - 02/15/16 [id number]      PT LONG TERM GOAL #1   Title Patient will reduce timed up and go to <11 seconds to reduce fall risk and demonstrate improved transfer/gait ability.   Baseline 02/15/16: 9.85sec   Time 8   Period Weeks   Status Achieved     PT LONG TERM GOAL #2   Title Patient will increase 10 meter walk test to >1.66m/s as to improve gait speed for better community ambulation and to reduce fall risk   Baseline 02/15/16: 1.34 m/sec   Time 8   Period Weeks   Status Achieved     PT LONG TERM GOAL #3   Title Patient will increase R ankle  gross strength to 4+/5 as to improve functional strength for independent gait, increased standing tolerance and increased ADL ability.   Baseline 02/15/16: 3+ to 4+/5   Time 8   Period Weeks   Status On-going     PT LONG TERM GOAL #4   Title Patient will report a worst pain of 3/10 on VAS in right ankle to improve tolerance with ADLs and reduced symptoms with activities.    Baseline 5/10 02/15/16: Worst pain 3/10 in past week   Time 8   Period Weeks   Status Achieved     PT LONG TERM GOAL #5   Title Patient will increase six minute walk test distance to >1000 for progression to community ambulator and improve gait ability   Baseline 02/15/16: 1365sec   Time 8   Period Weeks   Status Achieved     Additional Long Term Goals   Additional Long Term Goals Yes     PT LONG TERM GOAL #6   Title Patient will demonstrate independence with HEP to continue benefits from therapy after discharge.    Baseline 02/15/16: Requires frequent cueing on body positioning and technqiue with exercise   Time 8   Period Weeks   Status New     PT LONG TERM GOAL #7   Title Patient will be able to perform single leg stance on R LE for >10sec to demonstrates improved static balance and ability to donn pants more functionally.    Baseline Single leg stance on  R LE : 3 sec   Time 8   Period  Weeks   Status New               Plan - 03/07/16 1730    Clinical Impression Statement Patient demonstrates improved ankle stabilization when performing standing exercises today indicating functional improvement of visitations. Patient continues to demonstrate decreased muscular endurance and coordination with exercises and will benefit from further skilled therapy addressing these limitations to return to prior level of function.  Rehab Potential Good   Clinical Impairments Affecting Rehab Potential chronic ankle injury   PT Frequency 2x / week   PT Duration 8 weeks   PT Treatment/Interventions Cryotherapy;Moist Heat;Ultrasound;Therapeutic activities;Gait training;Therapeutic exercise;Balance training;Neuromuscular re-education;Passive range of motion   PT Next Visit Plan Standing Ankle stabilization advancement in progression   PT Home Exercise Plan YTB PF    Consulted and Agree with Plan of Care Patient      Patient will benefit from skilled therapeutic intervention in order to improve the following deficits and impairments:  Abnormal gait, Decreased balance, Decreased endurance, Decreased mobility, Difficulty walking, Decreased activity tolerance, Impaired flexibility, Pain, Decreased strength, Impaired sensation  Visit Diagnosis: Difficulty in walking, not elsewhere classified  Muscle weakness (generalized)     Problem List There are no active problems to display for this patient.   Blythe Stanford, PT DPT 03/07/2016, 5:33 PM  [place] MAIN [facility] SERVICES [address] [place], [place], [address] Phone: [phone]   Fax:  [phone]  Name: Dennis Lawrence MRN: [id number] Date of Birth: 12-31-1950

## 2016-03-09 ENCOUNTER — Ambulatory Visit: Payer: Medicare Other

## 2016-03-09 DIAGNOSIS — M6281 Muscle weakness (generalized): Secondary | ICD-10-CM

## 2016-03-09 DIAGNOSIS — R262 Difficulty in walking, not elsewhere classified: Secondary | ICD-10-CM

## 2016-03-09 NOTE — Therapy (Signed)
[place] MAIN [facility] SERVICES [address] [place], [place], [address] Phone: [phone]   Fax:  [phone]  Physical Therapy Treatment  Patient Details  Name: Dennis Lawrence MRN: [id number] Date of Birth: [birth year] Referring Provider: Samara Deist  Encounter Date: 03/09/2016      PT End of Session - 03/09/16 0838    Visit Number 17   Number of Visits 29   Date for PT Re-Evaluation 03/28/16   Authorization Type 6/10 G Code   PT Start Time 0807   PT Stop Time 0845   PT Time Calculation (min) 38 min   Activity Tolerance Patient tolerated treatment well   Behavior During Therapy [facility] for tasks assessed/performed      Past Medical History:  Diagnosis Date  . Arthritis    HANDS  . Back pain    CHRONIC, DEGENERATION OF LUMBAR OR LUMBOSACRAL INTERVERTEBRAL DISC  . H/O seasonal allergies   . History of positive PPD    NORMAL CHEST XRAY 09/1998  . Hyperlipidemia   . Hypertension   . Neuromuscular disorder (HCC)    NEURALGIA AND NEURITIS  . Restless leg syndrome   . 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 ANKLE ARTHROSCOPY Right 11/11/2015   Procedure: ANKLE ARTHROSCOPY/OCD REPAIR;  Surgeon: Samara Deist, DPM;  Location: [place];  Service: Podiatry;  Laterality: Right;  POPLITEAL  . ANTERIOR TALOFIBULAR LIGAMENT REPAIR Right 11/11/2015   Procedure: ANKLE BROSTROM-GOULD PROCEDURE;  Surgeon: Samara Deist, DPM;  Location: [place];  Service: Podiatry;  Laterality: Right;  . BACK SURGERY     L5-S1 DISCECTOMY, NUMBNESS LEFT BIG TOE  . COLONOSCOPY    . FUNCTIONAL ENDOSCOPIC SINUS SURGERY    . TENDON REPAIR Right 11/11/2015   Procedure: FLEXOR TENDON REPAIR-SECOND, TENOLYSIS-SINGLE;  Surgeon: Samara Deist, DPM;  Location: [place];  Service: Podiatry;  Laterality: Right;    There were no vitals filed  for this visit.      Subjective Assessment - 03/09/16 0833    Subjective Patient reports he's having less pain when walking, but continues to demonstrate increased pain when performing toe off in standing.    Pertinent History Patient fell of a ladder 5 months ago and fractured his foot. His surgery was 11/03/15.    Limitations Standing;Walking   How long can you sit comfortably? siiting is not a problem   How long can you stand comfortably? 15 minutes   How long can you walk comfortably? 1-2 minutes   Patient Stated Goals to be able to ambulate without AD and go back to work.   Currently in Pain? No/denies   Pain Onset More than a month ago         TREATMENT: Manual Therapy: STM performed throughout the plantar fascia with the pt in long sitting; Performed Talocrual, subtalar distraction and A->P mobilizations for 5 x 30secs. Performed intermetatarsal mobilizations and MTP joint mobilizations throughout all MTP joints on the R L --  3 x 30sec.  A->P grade IV mobilizations in standing when translating the knee forward on a step - 3 x 30sec. Improved mobility in ankle/foot after manual therapy.    Therapeutic Exercise: Heel raises in standing on R LE only - x 25 and intermittent UE support Dynadisc ankle stabilization - ant/post, laterally, circles cw/ccw - 2 min each direction on SLS Leg Press at quantum -  2 x 20  for quadriceps; 2 x 20 for gastrocs  Manual resisted toe flexion in supine on R LE - x 10 with 5 sec holds Single leg balance with lateral rotations - x 15  Lunges with minimal UE support in standing - x 10 B cueing on joint positioning required throughout movement. Standing lunge stretch - 30 sec hold x 2       PT Education - 03/09/16 0837    Education provided Yes   Education Details Performing toe off in standing   Person(s) Educated Patient   Methods Explanation;Demonstration   Comprehension Verbalized understanding;Returned demonstration             PT  Long Term Goals - 02/15/16 [id number]      PT LONG TERM GOAL #1   Title Patient will reduce timed up and go to <11 seconds to reduce fall risk and demonstrate improved transfer/gait ability.   Baseline 02/15/16: 9.85sec   Time 8   Period Weeks   Status Achieved     PT LONG TERM GOAL #2   Title Patient will increase 10 meter walk test to >1.73m/s as to improve gait speed for better community ambulation and to reduce fall risk   Baseline 02/15/16: 1.34 m/sec   Time 8   Period Weeks   Status Achieved     PT LONG TERM GOAL #3   Title Patient will increase R ankle  gross strength to 4+/5 as to improve functional strength for independent gait, increased standing tolerance and increased ADL ability.   Baseline 02/15/16: 3+ to 4+/5   Time 8   Period Weeks   Status On-going     PT LONG TERM GOAL #4   Title Patient will report a worst pain of 3/10 on VAS in right ankle to improve tolerance with ADLs and reduced symptoms with activities.    Baseline 5/10 02/15/16: Worst pain 3/10 in past week   Time 8   Period Weeks   Status Achieved     PT LONG TERM GOAL #5   Title Patient will increase six minute walk test distance to >1000 for progression to community ambulator and improve gait ability   Baseline 02/15/16: 1365sec   Time 8   Period Weeks   Status Achieved     Additional Long Term Goals   Additional Long Term Goals Yes     PT LONG TERM GOAL #6   Title Patient will demonstrate independence with HEP to continue benefits from therapy after discharge.    Baseline 02/15/16: Requires frequent cueing on body positioning and technqiue with exercise   Time 8   Period Weeks   Status New     PT LONG TERM GOAL #7   Title Patient will be able to perform single leg stance on R LE for >10sec to demonstrates improved static balance and ability to donn pants more functionally.    Baseline Single leg stance on  R LE : 3 sec   Time 8   Period Weeks   Status New               Plan - 03/09/16  [id number]    Clinical Impression Statement Patient demonstrates improved mobility in the foot/toes after performing manual therapy and patient reports decreased pain with toe off. Patient continues to demonstrate decreased balance requiring UE support to perform standing exercises and will benefit from further skilled therapy to return to PLOF.    Rehab Potential Good   Clinical Impairments  Affecting Rehab Potential chronic ankle injury   PT Frequency 2x / week   PT Duration 8 weeks   PT Treatment/Interventions Cryotherapy;Moist Heat;Ultrasound;Therapeutic activities;Gait training;Therapeutic exercise;Balance training;Neuromuscular re-education;Passive range of motion   PT Next Visit Plan Standing Ankle stabilization advancement in progression   PT Home Exercise Plan YTB PF    Consulted and Agree with Plan of Care Patient      Patient will benefit from skilled therapeutic intervention in order to improve the following deficits and impairments:  Abnormal gait, Decreased balance, Decreased endurance, Decreased mobility, Difficulty walking, Decreased activity tolerance, Impaired flexibility, Pain, Decreased strength, Impaired sensation  Visit Diagnosis: Difficulty in walking, not elsewhere classified  Muscle weakness (generalized)     Problem List There are no active problems to display for this patient.   Blythe Stanford, PT DPT 03/09/2016, 8:45 AM  [place] MAIN [facility] SERVICES [address] [place], [place], [address] Phone: [phone]   Fax:  [phone]  Name: Dennis Lawrence MRN: [id number] Date of Birth: 05-Feb-1950

## 2016-03-14 ENCOUNTER — Ambulatory Visit: Payer: Medicare Other

## 2016-03-16 ENCOUNTER — Ambulatory Visit: Payer: Medicare Other

## 2016-03-16 DIAGNOSIS — R262 Difficulty in walking, not elsewhere classified: Secondary | ICD-10-CM

## 2016-03-16 DIAGNOSIS — M6281 Muscle weakness (generalized): Secondary | ICD-10-CM

## 2016-03-16 NOTE — Therapy (Signed)
[place] MAIN [facility] SERVICES [address] [place], [place], [address] Phone: [phone]   Fax:  [phone]  Physical Therapy Treatment  Patient Details  Name: Dennis Lawrence MRN: [id number] Date of Birth: 1950-05-05 Referring Provider: Samara Deist  Encounter Date: 03/16/2016      PT End of Session - 03/16/16 0845    Visit Number 18   Number of Visits 29   Date for PT Re-Evaluation 03/28/16   Authorization Type 7/10 G Code   PT Start Time 0806   PT Stop Time 0846   PT Time Calculation (min) 40 min   Activity Tolerance Patient tolerated treatment well   Behavior During Therapy [facility] for tasks assessed/performed      Past Medical History:  Diagnosis Date  . Arthritis    HANDS  . Back pain    CHRONIC, DEGENERATION OF LUMBAR OR LUMBOSACRAL INTERVERTEBRAL DISC  . H/O seasonal allergies   . History of positive PPD    NORMAL CHEST XRAY 09/1998  . Hyperlipidemia   . Hypertension   . Neuromuscular disorder (HCC)    NEURALGIA AND NEURITIS  . Restless leg syndrome   . 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 ANKLE ARTHROSCOPY Right 11/11/2015   Procedure: ANKLE ARTHROSCOPY/OCD REPAIR;  Surgeon: Samara Deist, DPM;  Location: [place];  Service: Podiatry;  Laterality: Right;  POPLITEAL  . ANTERIOR TALOFIBULAR LIGAMENT REPAIR Right 11/11/2015   Procedure: ANKLE BROSTROM-GOULD PROCEDURE;  Surgeon: Samara Deist, DPM;  Location: [place];  Service: Podiatry;  Laterality: Right;  . BACK SURGERY     L5-S1 DISCECTOMY, NUMBNESS LEFT BIG TOE  . COLONOSCOPY    . FUNCTIONAL ENDOSCOPIC SINUS SURGERY    . TENDON REPAIR Right 11/11/2015   Procedure: FLEXOR TENDON REPAIR-SECOND, TENOLYSIS-SINGLE;  Surgeon: Samara Deist, DPM;  Location: [place];  Service: Podiatry;  Laterality: Right;    There were no vitals filed  for this visit.      Subjective Assessment - 03/16/16 0834    Subjective Patient reports he's been working on his son's house, and needs to work all day on the property today.    Pertinent History Patient fell of a ladder 5 months ago and fractured his foot. His surgery was 11/03/15.    Limitations Standing;Walking   How long can you sit comfortably? siiting is not a problem   How long can you stand comfortably? 15 minutes   How long can you walk comfortably? 1-2 minutes   Patient Stated Goals to be able to ambulate without AD and go back to work.   Currently in Pain? No/denies   Pain Onset More than a month ago      TREATMENT: Manual Therapy: STM performed throughout the plantar fascia with the pt in long sitting; Performed Talocrual, subtalar distraction and A->P mobilizations for 5 x 30secs. Performed intermetatarsal mobilizations and MTP joint mobilizations throughout all MTP joints on the R L --  3 x 30sec.  A->P grade IV mobilizations in standing when translating the knee forward on a step - 3 x 30sec. Improved mobility in ankle/foot after manual therapy.    Therapeutic Exercise: Heel raises in standing on B LE - x 25 and intermittent UE support with UE support Wobbleboard forward/back; laterally - x2 min each direction Single leg stance on half foam roller - 45sec B; Heel/toe raises on  half foam roller SLS - x20 Tandem stance on half foam roller - 1 min          PT Education - 03/16/16 0846    Education provided Yes   Education Details Form/technique with exercises   Person(s) Educated Patient   Methods Explanation;Demonstration   Comprehension Verbalized understanding;Returned demonstration             PT Long Term Goals - 02/15/16 [id number]      PT LONG TERM GOAL #1   Title Patient will reduce timed up and go to <11 seconds to reduce fall risk and demonstrate improved transfer/gait ability.   Baseline 02/15/16: 9.85sec   Time 8   Period Weeks   Status Achieved      PT LONG TERM GOAL #2   Title Patient will increase 10 meter walk test to >1.29m/s as to improve gait speed for better community ambulation and to reduce fall risk   Baseline 02/15/16: 1.34 m/sec   Time 8   Period Weeks   Status Achieved     PT LONG TERM GOAL #3   Title Patient will increase R ankle  gross strength to 4+/5 as to improve functional strength for independent gait, increased standing tolerance and increased ADL ability.   Baseline 02/15/16: 3+ to 4+/5   Time 8   Period Weeks   Status On-going     PT LONG TERM GOAL #4   Title Patient will report a worst pain of 3/10 on VAS in right ankle to improve tolerance with ADLs and reduced symptoms with activities.    Baseline 5/10 02/15/16: Worst pain 3/10 in past week   Time 8   Period Weeks   Status Achieved     PT LONG TERM GOAL #5   Title Patient will increase six minute walk test distance to >1000 for progression to community ambulator and improve gait ability   Baseline 02/15/16: 1365sec   Time 8   Period Weeks   Status Achieved     Additional Long Term Goals   Additional Long Term Goals Yes     PT LONG TERM GOAL #6   Title Patient will demonstrate independence with HEP to continue benefits from therapy after discharge.    Baseline 02/15/16: Requires frequent cueing on body positioning and technqiue with exercise   Time 8   Period Weeks   Status New     PT LONG TERM GOAL #7   Title Patient will be able to perform single leg stance on R LE for >10sec to demonstrates improved static balance and ability to donn pants more functionally.    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 Patient demonstrates increased postural sway when performing narrow BOS  balance indicating impaired balancing strategies and will benefit from further skilled therapy to improve ankle function and improve balance ability.    Rehab Potential Good   Clinical Impairments Affecting Rehab Potential chronic ankle injury   PT Frequency 2x / week   PT Duration 8 weeks   PT Treatment/Interventions Cryotherapy;Moist Heat;Ultrasound;Therapeutic activities;Gait training;Therapeutic exercise;Balance training;Neuromuscular re-education;Passive range of motion   PT Next Visit  Plan Standing Ankle stabilization advancement in progression   PT Home Exercise Plan YTB PF    Consulted and Agree with Plan of Care Patient      Patient will benefit from skilled therapeutic intervention in order to improve the following deficits and impairments:  Abnormal gait, Decreased balance, Decreased endurance, Decreased mobility, Difficulty walking, Decreased activity tolerance, Impaired flexibility, Pain, Decreased strength, Impaired sensation  Visit Diagnosis: Difficulty in walking, not elsewhere classified  Muscle weakness (generalized)     Problem List There are no active problems to display for this patient.   Blythe Stanford, PT DPT 03/16/2016, 9:43 AM  [place] MAIN [facility] SERVICES [address] [place], [place], [address] Phone: [phone]   Fax:  [phone]  Name: Dennis Lawrence MRN: [id number] Date of Birth: [birth year]

## 2016-03-21 ENCOUNTER — Ambulatory Visit: Payer: Medicare Other

## 2016-03-23 ENCOUNTER — Ambulatory Visit: Payer: Medicare Other | Attending: Podiatry

## 2016-03-23 DIAGNOSIS — M6281 Muscle weakness (generalized): Secondary | ICD-10-CM | POA: Diagnosis present

## 2016-03-23 DIAGNOSIS — R262 Difficulty in walking, not elsewhere classified: Secondary | ICD-10-CM | POA: Diagnosis not present

## 2016-03-23 NOTE — Therapy (Signed)
[place] MAIN [facility] SERVICES [address] [place], [place], [address] Phone: [phone]   Fax:  [phone]  Physical Therapy Treatment  Patient Details  Name: Dennis Lawrence MRN: [id number] Date of Birth: 30-Aug-1950 Referring Provider: Samara Deist  Encounter Date: 03/23/2016      PT End of Session - 03/23/16 0954    Visit Number 19   Number of Visits 29   Date for PT Re-Evaluation 03/28/16   Authorization Type 8/10 G Code   PT Start Time 0800   PT Stop Time 0845   PT Time Calculation (min) 45 min   Activity Tolerance Patient tolerated treatment well   Behavior During Therapy [facility] for tasks assessed/performed      Past Medical History:  Diagnosis Date  . Arthritis    HANDS  . Back pain    CHRONIC, DEGENERATION OF LUMBAR OR LUMBOSACRAL INTERVERTEBRAL DISC  . H/O seasonal allergies   . History of positive PPD    NORMAL CHEST XRAY 09/1998  . Hyperlipidemia   . Hypertension   . Neuromuscular disorder (HCC)    NEURALGIA AND NEURITIS  . Restless leg syndrome   . 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 ANKLE ARTHROSCOPY Right 11/11/2015   Procedure: ANKLE ARTHROSCOPY/OCD REPAIR;  Surgeon: Samara Deist, DPM;  Location: [place];  Service: Podiatry;  Laterality: Right;  POPLITEAL  . ANTERIOR TALOFIBULAR LIGAMENT REPAIR Right 11/11/2015   Procedure: ANKLE BROSTROM-GOULD PROCEDURE;  Surgeon: Samara Deist, DPM;  Location: [place];  Service: Podiatry;  Laterality: Right;  . BACK SURGERY     L5-S1 DISCECTOMY, NUMBNESS LEFT BIG TOE  . COLONOSCOPY    . FUNCTIONAL ENDOSCOPIC SINUS SURGERY    . TENDON REPAIR Right 11/11/2015   Procedure: FLEXOR TENDON REPAIR-SECOND, TENOLYSIS-SINGLE;  Surgeon: Samara Deist, DPM;  Location: [place];  Service: Podiatry;  Laterality: Right;    There were no vitals filed  for this visit.      Subjective Assessment - 03/23/16 0947    Subjective Patient reports he's going to [facility] in 2 weeks and states he continues to have minimal pain when performing toe off when walking.   Pertinent History Patient fell of a ladder 5 months ago and fractured his foot. His surgery was 11/03/15.    Limitations Standing;Walking   How long can you sit comfortably? siiting is not a problem   How long can you stand comfortably? 15 minutes   How long can you walk comfortably? 1-2 minutes   Patient Stated Goals to be able to ambulate without AD and go back to work.   Currently in Pain? No/denies   Pain Onset More than a month ago        TREATMENT: Manual Therapy: STM performed throughout the plantar fascia and between the metatarsals with the pt in long sitting; Performed Talocrual, subtalar distraction and A->P mobilizations for 5 x 30secs. Performed intermetatarsal mobilizations, interphalangeal joint,  MTP joint mobilizations throughout all MTP joints on the R L --  3 x 30sec.    Therapeutic Exercise: Heel raises in standing on R LE only- x 20 and intermittent UE support with UE support onto 2x4 BAPS forward/back; laterally, cw/ccw - x2 min each direction Resisted toe flexion against manual resistance - 2 x 20 Ambulation with cueing on improving toe off - 240ft  PT Education - 03/23/16 0951    Education provided Yes   Education Details Form/technique with exercises   Person(s) Educated Patient   Methods Explanation;Demonstration   Comprehension Verbalized understanding;Returned demonstration             PT Long Term Goals - 02/15/16 0852      PT LONG TERM GOAL #1   Title Patient will reduce timed up and go to <11 seconds to reduce fall risk and demonstrate improved transfer/gait ability.   Baseline 02/15/16: 9.85sec   Time 8   Period Weeks   Status Achieved     PT LONG TERM GOAL #2   Title Patient will increase 10 meter walk test to >1.75m/s as  to improve gait speed for better community ambulation and to reduce fall risk   Baseline 02/15/16: 1.34 m/sec   Time 8   Period Weeks   Status Achieved     PT LONG TERM GOAL #3   Title Patient will increase R ankle  gross strength to 4+/5 as to improve functional strength for independent gait, increased standing tolerance and increased ADL ability.   Baseline 02/15/16: 3+ to 4+/5   Time 8   Period Weeks   Status On-going     PT LONG TERM GOAL #4   Title Patient will report a worst pain of 3/10 on VAS in right ankle to improve tolerance with ADLs and reduced symptoms with activities.    Baseline 5/10 02/15/16: Worst pain 3/10 in past week   Time 8   Period Weeks   Status Achieved     PT LONG TERM GOAL #5   Title Patient will increase six minute walk test distance to >1000 for progression to community ambulator and improve gait ability   Baseline 02/15/16: 1365sec   Time 8   Period Weeks   Status Achieved     Additional Long Term Goals   Additional Long Term Goals Yes     PT LONG TERM GOAL #6   Title Patient will demonstrate independence with HEP to continue benefits from therapy after discharge.    Baseline 02/15/16: Requires frequent cueing on body positioning and technqiue with exercise   Time 8   Period Weeks   Status New     PT LONG TERM GOAL #7   Title Patient will be able to perform single leg stance on R LE for >10sec to demonstrates improved static balance and ability to donn pants more functionally.    Baseline Single leg stance on  R LE : 3 sec   Time 8   Period Weeks   Status New               Plan - 03/23/16 1015    Clinical Impression Statement Focused on performing manual therapy exercises to improve toe and ankle AROM to decrease pain with push off portion of gait. Patient demonstrates decreased pain in the foot/toes when performing toe after manual therapy indicating decreased spasms; but continues to demonstrate pain. Patient will benefit from further  skilled therapy focused on improving ankle/toe stabilization to return to prior level of function.    Rehab Potential Good   Clinical Impairments Affecting Rehab Potential chronic ankle injury   PT Frequency 2x / week   PT Duration 8 weeks   PT Treatment/Interventions Cryotherapy;Moist Heat;Ultrasound;Therapeutic activities;Gait training;Therapeutic exercise;Balance training;Neuromuscular re-education;Passive range of motion   PT Next Visit Plan Standing Ankle stabilization advancement in progression   PT Home Exercise Plan YTB PF  Consulted and Agree with Plan of Care Patient      Patient will benefit from skilled therapeutic intervention in order to improve the following deficits and impairments:  Abnormal gait, Decreased balance, Decreased endurance, Decreased mobility, Difficulty walking, Decreased activity tolerance, Impaired flexibility, Pain, Decreased strength, Impaired sensation  Visit Diagnosis: Difficulty in walking, not elsewhere classified  Muscle weakness (generalized)     Problem List There are no active problems to display for this patient.   Blythe Stanford, PT DPT 03/23/2016, 10:40 AM  [place] MAIN [facility] SERVICES [address] [place], [place], [address] Phone: [phone]   Fax:  [phone]  Name: Dennis Lawrence MRN: [id number] Date of Birth: Oct 29, 1950

## 2016-03-28 ENCOUNTER — Ambulatory Visit: Payer: Medicare Other

## 2016-03-28 DIAGNOSIS — R262 Difficulty in walking, not elsewhere classified: Secondary | ICD-10-CM

## 2016-03-28 DIAGNOSIS — M6281 Muscle weakness (generalized): Secondary | ICD-10-CM

## 2016-03-28 NOTE — Therapy (Signed)
[place] MAIN [facility] SERVICES [address] [place], [place], [address] Phone: [phone]   Fax:  [phone]  Physical Therapy Treatment  Patient Details  Name: Dennis Lawrence MRN: [id number] Date of Birth: [birth year] Referring Provider: Samara Deist  Encounter Date: 03/28/2016      PT End of Session - 03/28/16 0914    Visit Number 20   Number of Visits 29   Date for PT Re-Evaluation 03/28/16   Authorization Type 9/10 G Code   PT Start Time 0805   PT Stop Time 0845   PT Time Calculation (min) 40 min   Activity Tolerance Patient tolerated treatment well   Behavior During Therapy [facility] for tasks assessed/performed      Past Medical History:  Diagnosis Date  . Arthritis    HANDS  . Back pain    CHRONIC, DEGENERATION OF LUMBAR OR LUMBOSACRAL INTERVERTEBRAL DISC  . H/O seasonal allergies   . History of positive PPD    NORMAL CHEST XRAY 09/1998  . Hyperlipidemia   . Hypertension   . Neuromuscular disorder (HCC)    NEURALGIA AND NEURITIS  . Restless leg syndrome   . 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 ANKLE ARTHROSCOPY Right 11/11/2015   Procedure: ANKLE ARTHROSCOPY/OCD REPAIR;  Surgeon: Samara Deist, DPM;  Location: [place];  Service: Podiatry;  Laterality: Right;  POPLITEAL  . ANTERIOR TALOFIBULAR LIGAMENT REPAIR Right 11/11/2015   Procedure: ANKLE BROSTROM-GOULD PROCEDURE;  Surgeon: Samara Deist, DPM;  Location: [place];  Service: Podiatry;  Laterality: Right;  . BACK SURGERY     L5-S1 DISCECTOMY, NUMBNESS LEFT BIG TOE  . COLONOSCOPY    . FUNCTIONAL ENDOSCOPIC SINUS SURGERY    . TENDON REPAIR Right 11/11/2015   Procedure: FLEXOR TENDON REPAIR-SECOND, TENOLYSIS-SINGLE;  Surgeon: Samara Deist, DPM;  Location: [place];  Service: Podiatry;  Laterality: Right;    There were no vitals filed  for this visit.      Subjective Assessment - 03/28/16 0912    Subjective Patient reports his foot is feeling about the same since his previous visit. Patient states he continues to have discomfort when performing walking exercise.   Pertinent History Patient fell of a ladder 5 months ago and fractured his foot. His surgery was 11/03/15.    Limitations Standing;Walking   How long can you sit comfortably? siiting is not a problem   How long can you stand comfortably? 15 minutes   How long can you walk comfortably? 1-2 minutes   Patient Stated Goals to be able to ambulate without AD and go back to work.   Currently in Pain? No/denies   Pain Onset More than a month ago      TREATMENT: Manual Therapy: STM performed throughout the plantar fascia and between the metatarsals with the pt in long sitting; Performed Talocrual, subtalar distraction and A->P mobilizations for 5 x 30secs. Performed intermetatarsal mobilizations, interphalangeal joint,  MTP joint mobilizations throughout all MTP joints on the R L --  3 x 30sec.    Therapeutic Exercise: Resisted toe flexion against manual resistance -  x 20 Ambulation with cueing on improving toe off - 267ft   Rolling golf ball over plantarfascia and between MTP joints - 2 min Sitting foot stretch with toes in flexion with OP - 3 x 30sec holds      PT Education -  03/28/16 0914    Education provided Yes   Education Details form/technique with exercise   Person(s) Educated Patient   Methods Explanation;Demonstration   Comprehension Verbalized understanding;Returned demonstration             PT Long Term Goals - 02/15/16 [id number]      PT LONG TERM GOAL #1   Title Patient will reduce timed up and go to <11 seconds to reduce fall risk and demonstrate improved transfer/gait ability.   Baseline 02/15/16: 9.85sec   Time 8   Period Weeks   Status Achieved     PT LONG TERM GOAL #2   Title Patient will increase 10 meter walk test to >1.45m/s as  to improve gait speed for better community ambulation and to reduce fall risk   Baseline 02/15/16: 1.34 m/sec   Time 8   Period Weeks   Status Achieved     PT LONG TERM GOAL #3   Title Patient will increase R ankle  gross strength to 4+/5 as to improve functional strength for independent gait, increased standing tolerance and increased ADL ability.   Baseline 02/15/16: 3+ to 4+/5   Time 8   Period Weeks   Status On-going     PT LONG TERM GOAL #4   Title Patient will report a worst pain of 3/10 on VAS in right ankle to improve tolerance with ADLs and reduced symptoms with activities.    Baseline 5/10 02/15/16: Worst pain 3/10 in past week   Time 8   Period Weeks   Status Achieved     PT LONG TERM GOAL #5   Title Patient will increase six minute walk test distance to >1000 for progression to community ambulator and improve gait ability   Baseline 02/15/16: 1365sec   Time 8   Period Weeks   Status Achieved     Additional Long Term Goals   Additional Long Term Goals Yes     PT LONG TERM GOAL #6   Title Patient will demonstrate independence with HEP to continue benefits from therapy after discharge.    Baseline 02/15/16: Requires frequent cueing on body positioning and technqiue with exercise   Time 8   Period Weeks   Status New     PT LONG TERM GOAL #7   Title Patient will be able to perform single leg stance on R LE for >10sec to demonstrates improved static balance and ability to donn pants more functionally.    Baseline Single leg stance on  R LE : 3 sec   Time 8   Period Weeks   Status New               Plan - 03/28/16 [id number]    Clinical Impression Statement Continued to focus on improving soft tissue and joint mobility in the foot today as patient demonstrates foot pain during the toe off portion of gait. Patient demonstrates improvement in patient reported tissue elasticity when walking after performing manual therapy. Reinforced HEP for patient to work on at home and  patient will benefit from further skilled therapy to return to prior level of function.     Rehab Potential Good   Clinical Impairments Affecting Rehab Potential chronic ankle injury   PT Frequency 2x / week   PT Duration 8 weeks   PT Treatment/Interventions Cryotherapy;Moist Heat;Ultrasound;Therapeutic activities;Gait training;Therapeutic exercise;Balance training;Neuromuscular re-education;Passive range of motion   PT Next Visit Plan Standing Ankle stabilization advancement in progression   PT Home Exercise Plan YTB PF  Consulted and Agree with Plan of Care Patient      Patient will benefit from skilled therapeutic intervention in order to improve the following deficits and impairments:  Abnormal gait, Decreased balance, Decreased endurance, Decreased mobility, Difficulty walking, Decreased activity tolerance, Impaired flexibility, Pain, Decreased strength, Impaired sensation  Visit Diagnosis: Difficulty in walking, not elsewhere classified  Muscle weakness (generalized)     Problem List There are no active problems to display for this patient.   Blythe Stanford, PT DPT 03/28/2016, 9:41 AM  [place] MAIN [facility] SERVICES [address] [place], [place], [address] Phone: [phone]   Fax:  [phone]  Name: Dennis Lawrence MRN: [id number] Date of Birth: May 04, 1950

## 2016-03-30 ENCOUNTER — Ambulatory Visit: Payer: Medicare Other

## 2016-03-30 DIAGNOSIS — R262 Difficulty in walking, not elsewhere classified: Secondary | ICD-10-CM

## 2016-03-30 DIAGNOSIS — M6281 Muscle weakness (generalized): Secondary | ICD-10-CM

## 2016-03-30 NOTE — Therapy (Signed)
[place] MAIN [facility] SERVICES [address] [place], [place], [address] Phone: [phone]   Fax:  [phone]  Physical Therapy Treatment  Patient Details  Name: Dennis Lawrence MRN: [id number] Date of Birth: 11-09-1950 Referring Provider: Samara Deist  Encounter Date: 03/30/2016   Patient has attended 21 out of 21 visits and has met or partially all long term goals. Patient is to be discharged from physical therapy.       PT End of Session - 03/30/16 0842    Visit Number 21   Number of Visits 29   Date for PT Re-Evaluation 03/28/16   Authorization Type 10/10 G Code   PT Start Time 0800   PT Stop Time 0832   PT Time Calculation (min) 32 min   Activity Tolerance Patient tolerated treatment well   Behavior During Therapy [facility] for tasks assessed/performed      Past Medical History:  Diagnosis Date  . Arthritis    HANDS  . Back pain    CHRONIC, DEGENERATION OF LUMBAR OR LUMBOSACRAL INTERVERTEBRAL DISC  . H/O seasonal allergies   . History of positive PPD    NORMAL CHEST XRAY 09/1998  . Hyperlipidemia   . Hypertension   . Neuromuscular disorder (HCC)    NEURALGIA AND NEURITIS  . Restless leg syndrome   . 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 ANKLE ARTHROSCOPY Right 11/11/2015   Procedure: ANKLE ARTHROSCOPY/OCD REPAIR;  Surgeon: Samara Deist, DPM;  Location: [place];  Service: Podiatry;  Laterality: Right;  POPLITEAL  . ANTERIOR TALOFIBULAR LIGAMENT REPAIR Right 11/11/2015   Procedure: ANKLE BROSTROM-GOULD PROCEDURE;  Surgeon: Samara Deist, DPM;  Location: [place];  Service: Podiatry;  Laterality: Right;  . BACK SURGERY     L5-S1 DISCECTOMY, NUMBNESS LEFT BIG TOE  . COLONOSCOPY    . FUNCTIONAL ENDOSCOPIC SINUS SURGERY    . TENDON REPAIR Right 11/11/2015   Procedure: FLEXOR TENDON REPAIR-SECOND,  TENOLYSIS-SINGLE;  Surgeon: Samara Deist, DPM;  Location: [place];  Service: Podiatry;  Laterality: Right;    There were no vitals filed for this visit.      Subjective Assessment - 03/30/16 0840    Subjective Patient reports no major changes in his foot/ankle, reports he is ready for discharge secondary to increase work related requirements.    Pertinent History Patient fell of a ladder 5 months ago and fractured his foot. His surgery was 11/03/15.    How long can you sit comfortably? siiting is not a problem   How long can you stand comfortably? 15 minutes   How long can you walk comfortably? 1-2 minutes   Patient Stated Goals to be able to ambulate without AD and go back to work.   Currently in Pain? No/denies        TREATMENT: Manual Therapy: STM performed throughout the plantar fascia and between the metatarsals with the pt in long sitting; Performed Talocrual, subtalar distraction and A->P mobilizations for 5 x 30secs. Performed intermetatarsal mobilizations, interphalangeal joint,  MTP joint mobilizations throughout all MTP joints on the R L --  3 x 30sec.    Therapeutic Exercise: Resisted toe flexion against manual resistance -  5 sec hold x 5  Ambulation with cueing on improving toe off - 164f    Observation:   SLS: 5sec  PT Education - 03/30/16 0841    Education provided Yes   Education Details Educated on HEP and self manual therapy with golf ball    Person(s) Educated Patient   Methods Explanation;Demonstration   Comprehension Verbalized understanding;Returned demonstration             PT Long Term Goals - 03/30/16 0845      PT LONG TERM GOAL #1   Title Patient will reduce timed up and go to <11 seconds to reduce fall risk and demonstrate improved transfer/gait ability.   Baseline 02/15/16: 9.85sec   Time 8   Period Weeks   Status Achieved     PT LONG TERM GOAL #2   Title Patient will increase 10 meter walk test to >1.59ms  as to improve gait speed for better community ambulation and to reduce fall risk   Baseline 02/15/16: 1.34 m/sec   Time 8   Period Weeks   Status Achieved     PT LONG TERM GOAL #3   Title Patient will increase R ankle  gross strength to 4+/5 as to improve functional strength for independent gait, increased standing tolerance and increased ADL ability.   Baseline 02/15/16: 3+ to 4+/5 03/30/16: 4+/5 througout foot/ankle   Time 8   Period Weeks   Status Achieved     PT LONG TERM GOAL #4   Title Patient will report a worst pain of 3/10 on VAS in right ankle to improve tolerance with ADLs and reduced symptoms with activities.    Baseline 5/10 02/15/16: Worst pain 3/10 in past week   Time 8   Period Weeks   Status Achieved     PT LONG TERM GOAL #5   Title Patient will increase six minute walk test distance to >1000 for progression to community ambulator and improve gait ability   Baseline 02/15/16: 1365sec   Time 8   Period Weeks   Status Achieved     PT LONG TERM GOAL #6   Title Patient will demonstrate independence with HEP to continue benefits from therapy after discharge.    Baseline 02/15/16: Requires frequent cueing on body positioning and technqiue with exercise 03/30/16: Indepedent with exercise performance and technique   Time 8   Period Weeks   Status Achieved     PT LONG TERM GOAL #7   Title Patient will be able to perform single leg stance on R LE for >10sec to demonstrates improved static balance and ability to donn pants more functionally.    Baseline Single leg stance on  R LE : 3 sec R LE: 5 sec   Time 8   Period Weeks   Status Partially Met               Plan - 03/30/16 0842    Clinical Impression Statement Patient demonstrates improvement in foot/ankle function demonstrating improved single leg stance time and decreased ankle pain compared to previous visits. Patient continues to have minimal pain when ambulating but reports pain has greatly improved. Patient has  met long term functional goals and is to be D/C'ed from physical therapy.    Rehab Potential Good   Clinical Impairments Affecting Rehab Potential chronic ankle injury   PT Frequency 2x / week   PT Duration 8 weeks   PT Treatment/Interventions Cryotherapy;Moist Heat;Ultrasound;Therapeutic activities;Gait training;Therapeutic exercise;Balance training;Neuromuscular re-education;Passive range of motion   PT Next Visit Plan D/C   PT Home Exercise Plan YTB PF    Consulted and Agree with Plan of Care  Patient      Patient will benefit from skilled therapeutic intervention in order to improve the following deficits and impairments:  Abnormal gait, Decreased balance, Decreased endurance, Decreased mobility, Difficulty walking, Decreased activity tolerance, Impaired flexibility, Pain, Decreased strength, Impaired sensation  Visit Diagnosis: Difficulty in walking, not elsewhere classified  Muscle weakness (generalized)       G-Codes - Apr 07, 2016 0901    Functional Assessment Tool Used (Outpatient Only) Single leg stance time, MMT, AROM, gait assessment, clinical judgement   Functional Limitation Mobility: Walking and moving around   Mobility: Walking and Moving Around Current Status ([id number]) At least 1 percent but less than 20 percent impaired, limited or restricted   Mobility: Walking and Moving Around Goal Status [id number]) At least 1 percent but less than 20 percent impaired, limited or restricted      Problem List There are no active problems to display for this patient.   Blythe Stanford, PT DPT Apr 07, 2016, 11:09 AM  [place] MAIN [facility] SERVICES [address] [place], [place], [address] Phone: [phone]   Fax:  [phone]  Name: Dennis Lawrence MRN: [id number] Date of Birth: 30-Jul-1950

## 2016-03-31 ENCOUNTER — Encounter: Payer: Self-pay | Admitting: Emergency Medicine

## 2016-03-31 ENCOUNTER — Emergency Department
Admission: EM | Admit: 2016-03-31 | Discharge: 2016-03-31 | Disposition: A | Discharge status: Home / Self Care | Payer: Medicare Other | Attending: Student in an Organized Health Care Education/Training Program | Admitting: Student in an Organized Health Care Education/Training Program

## 2016-03-31 DIAGNOSIS — I10 Essential (primary) hypertension: Secondary | ICD-10-CM | POA: Insufficient documentation

## 2016-03-31 DIAGNOSIS — Y999 Unspecified external cause status: Secondary | ICD-10-CM | POA: Insufficient documentation

## 2016-03-31 DIAGNOSIS — Y939 Activity, unspecified: Secondary | ICD-10-CM | POA: Diagnosis not present

## 2016-03-31 DIAGNOSIS — Z79899 Other long term (current) drug therapy: Secondary | ICD-10-CM | POA: Insufficient documentation

## 2016-03-31 DIAGNOSIS — W293XXA Contact with powered garden and outdoor hand tools and machinery, initial encounter: Secondary | ICD-10-CM | POA: Diagnosis not present

## 2016-03-31 DIAGNOSIS — Y929 Unspecified place or not applicable: Secondary | ICD-10-CM | POA: Insufficient documentation

## 2016-03-31 DIAGNOSIS — S61211A Laceration without foreign body of left index finger without damage to nail, initial encounter: Secondary | ICD-10-CM | POA: Insufficient documentation

## 2016-03-31 MED ORDER — CLINDAMYCIN PHOSPHATE 600 MG/50ML IV SOLN
INTRAVENOUS | Status: AC
  Filled 2016-03-31: qty 50

## 2016-03-31 MED ORDER — BACITRACIN ZINC 500 UNIT/GM EX OINT
TOPICAL_OINTMENT | Freq: Two times a day (BID) | CUTANEOUS | Status: DC
  Administered 2016-03-31: 11:00:00 via TOPICAL
  Filled 2016-03-31: qty 0.9

## 2016-03-31 MED ORDER — OXYCODONE-ACETAMINOPHEN 5-325 MG PO TABS: 1.0000 | ORAL_TABLET | Freq: Four times a day (QID) | ORAL | 0 refills | Status: DC | PRN

## 2016-03-31 MED ORDER — LIDOCAINE HCL (PF) 1 % IJ SOLN
INTRAMUSCULAR | Status: AC
  Filled 2016-03-31: qty 5

## 2016-03-31 NOTE — ED Triage Notes (Signed)
Pt reports cutting left index finger today on hedge trimmer. Bleeding controlled. Small laceration noted.

## 2016-03-31 NOTE — ED Notes (Signed)
States left index finger pain and laceration from a electric hedge trimmer, bleeding controlled, states tetanus status is up to date, awake and alert in no acute distress

## 2016-03-31 NOTE — ED Provider Notes (Signed)
[facility] Emergency Department Provider Note   ____________________________________________   None    (approximate)  I have reviewed the triage vital signs and the nursing notes.   HISTORY  Chief Complaint Extremity Laceration    HPI Dennis Lawrence is a 66 y.o. male pacemaker's percent for laceration to the left index finger secondary to cut by a hinge tremor. Bleeding is controlled direct pressure. Patient denied loss sensation or loss of function of the finger. Patient states tetanus shot is up-to-date.Patient rates his pain as a 2/10. Patient described a pain as "dull". No other palliative measures for this complaint.   Past Medical History:  Diagnosis Date  . Arthritis    HANDS  . Back pain    CHRONIC, DEGENERATION OF LUMBAR OR LUMBOSACRAL INTERVERTEBRAL DISC  . H/O seasonal allergies   . History of positive PPD    NORMAL CHEST XRAY 09/1998  . Hyperlipidemia   . Hypertension   . Neuromuscular disorder (HCC)    NEURALGIA AND NEURITIS  . Restless leg syndrome   . 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   Past Surgical History:  Procedure Laterality Date  . ANKLE ARTHROSCOPY Right 11/11/2015   Procedure: ANKLE ARTHROSCOPY/OCD REPAIR;  Surgeon: Samara Deist, DPM;  Location: [place];  Service: Podiatry;  Laterality: Right;  POPLITEAL  . ANTERIOR TALOFIBULAR LIGAMENT REPAIR Right 11/11/2015   Procedure: ANKLE BROSTROM-GOULD PROCEDURE;  Surgeon: Samara Deist, DPM;  Location: [place];  Service: Podiatry;  Laterality: Right;  . BACK SURGERY     L5-S1 DISCECTOMY, NUMBNESS LEFT BIG TOE  . COLONOSCOPY    . FUNCTIONAL ENDOSCOPIC SINUS SURGERY    . TENDON REPAIR Right 11/11/2015   Procedure: FLEXOR TENDON REPAIR-SECOND, TENOLYSIS-SINGLE;  Surgeon: Samara Deist, DPM;  Location: [place];  Service: Podiatry;   Laterality: Right;    Prior to Admission medications   Medication Sig Start Date End Date Taking? Authorizing Provider  acetaminophen (TYLENOL) 500 MG tablet Take 1,000 mg by mouth every 6 (six) hours as needed.    Historical Provider, MD  ascorbic acid (VITAMIN C) 1000 MG tablet Take 1,000 mg by mouth daily.    Historical Provider, MD  celecoxib (CELEBREX) 200 MG capsule Take 200 mg by mouth 2 (two) times daily. AM AND PM    Historical Provider, MD  cyclobenzaprine (FLEXERIL) 10 MG tablet Take 10 mg by mouth 3 (three) times daily as needed for muscle spasms.    Historical Provider, MD  Glucosamine-Chondroitin 250-200 MG CAPS Take by mouth.    Historical Provider, MD  Multiple Vitamin (MULTIVITAMIN) tablet Take 1 tablet by mouth daily.    Historical Provider, MD  oxyCODONE-acetaminophen (ROXICET) 5-325 MG tablet Take 1-2 tablets by mouth every 4 (four) hours as needed for severe pain. 11/11/15   Samara Deist, DPM  oxyCODONE-acetaminophen (ROXICET) 5-325 MG tablet Take 1 tablet by mouth every 6 (six) hours as needed for moderate pain. 03/31/16   Sable Feil, PA-C  pramipexole (MIRAPEX) 1 MG tablet Take 1 mg by mouth at bedtime.     Historical Provider, MD  traMADol (ULTRAM) 50 MG tablet Take 1 tablet (50 mg total) by mouth 2 (two) times daily. Patient not taking: Reported on 11/04/2015 07/08/14   Dannielle Karvonen Menshew, PA-C    Allergies Patient has no known allergies.  Family History  Problem Relation  Age of Onset  . Asthma Mother   . Colon cancer Brother   . Stroke Brother     Social History Social History  Substance Use Topics  . Smoking status: Never Smoker  . Smokeless tobacco: Never Used  . Alcohol use No    Review of Systems Constitutional: No fever/chills Eyes: No visual changes. ENT: No sore throat. Cardiovascular: Denies chest pain. Respiratory: Denies shortness of breath. Gastrointestinal: No abdominal pain.  No nausea, no vomiting.  No diarrhea.  No  constipation. Genitourinary: Negative for dysuria. Musculoskeletal: Negative for back pain. Skin: Negative for rash. Neurological: Negative for headaches, focal weakness or numbness. Restless leg syndrome Endocrine:Hypertension hyperlipidemia  ____________________________________________   PHYSICAL EXAM:  VITAL SIGNS: ED Triage Vitals  Enc Vitals Group     BP 03/31/16 1029 127/81     Pulse Rate 03/31/16 1029 74     Resp 03/31/16 1029 15     Temp 03/31/16 1029 97.9 F (36.6 C)     Temp Source 03/31/16 1029 Oral     SpO2 03/31/16 1029 99 %     Weight 03/31/16 1033 220 lb (99.8 kg)     Height 03/31/16 1033 6\' 5"  (1.956 m)     Head Circumference --      Peak Flow --      Pain Score 03/31/16 1037 2     Pain Loc --      Pain Edu? --      Excl. in [place]? --     Constitutional: Alert and oriented. Well appearing and in no acute distress. Eyes: Conjunctivae are normal. PERRL. EOMI. Head: Atraumatic. Nose: No congestion/rhinnorhea. Mouth/Throat: Mucous membranes are moist.  Oropharynx non-erythematous. Neck: No stridor.  No cervical spine tenderness to palpation. Hematological/Lymphatic/Immunilogical: No cervical lymphadenopathy. Cardiovascular: Normal rate, regular rhythm. Grossly normal heart sounds.  Good peripheral circulation. Respiratory: Normal respiratory effort.  No retractions. Lungs CTAB. Gastrointestinal: Soft and nontender. No distention. No abdominal bruits. No CVA tenderness. Musculoskeletal: No lower extremity tenderness nor edema.  No joint effusions. Neurologic:  Normal speech and language. No gross focal neurologic deficits are appreciated. No gait instability. Skin:  Skin is warm, dry and intact. No rash noted.1.5 cm laceration proximal phalange secondary digit left hand. Psychiatric: Mood and affect are normal. Speech and behavior are normal.  ____________________________________________   LABS (all labs ordered are listed, but only abnormal results are  displayed)  Labs Reviewed - No data to display ____________________________________________  EKG   ____________________________________________  RADIOLOGY   ____________________________________________   PROCEDURES  Procedure(s) performed: LACERATION REPAIR Performed by: Sable Feil Authorized by: Sable Feil Consent: Verbal consent obtained. Risks and benefits: risks, benefits and alternatives were discussed Consent given by: patient Patient identity confirmed: provided demographic data Prepped and Draped in normal sterile fashion Wound explored  Laceration Location: Second digit left hand  Laceration Length: 1.5cm  No Foreign Bodies seen or palpated  Anesthesia: Digital block Local anesthetic: lidocaine 1% without epinephrine  Anesthetic total: 4 ml  Irrigation method: syringe Amount of cleaning: standard  Skin closure: 4-0 nylon Number of sutures: 4   Technique: Interrupted   Patient tolerance: Patient tolerated the procedure well with no immediate complications.   Procedures  Critical Care performed: No  ____________________________________________   INITIAL IMPRESSION / ASSESSMENT AND PLAN / ED COURSE  Pertinent labs & imaging results that were available during my care of the patient were reviewed by me and considered in my medical decision making (see chart for details).  Left finger laceration. Patient given discharge care instructions. Patient given a prescription for Percocet for 3 days. Patient advised to have sutures removed in 10 days.      ____________________________________________   FINAL CLINICAL IMPRESSION(S) / ED DIAGNOSES  Final diagnoses:  Laceration of left index finger without foreign body without damage to nail, initial encounter      NEW MEDICATIONS STARTED DURING THIS VISIT:  New Prescriptions   OXYCODONE-ACETAMINOPHEN (ROXICET) 5-325 MG TABLET    Take 1 tablet by mouth every 6 (six) hours as needed for  moderate pain.     Note:  This document was prepared using Dragon voice recognition software and may include unintentional dictation errors.    Sable Feil, PA-C 03/31/16 [place], MD 03/31/16 [phone]

## 2016-09-19 ENCOUNTER — Other Ambulatory Visit: Payer: Self-pay | Admitting: Unknown Physician Specialty

## 2016-09-19 DIAGNOSIS — R059 Cough, unspecified: Secondary | ICD-10-CM

## 2016-09-19 DIAGNOSIS — Z77090 Contact with and (suspected) exposure to asbestos: Secondary | ICD-10-CM

## 2016-09-19 DIAGNOSIS — R05 Cough: Secondary | ICD-10-CM

## 2016-09-19 DIAGNOSIS — R0602 Shortness of breath: Secondary | ICD-10-CM

## 2016-09-20 ENCOUNTER — Ambulatory Visit
Admission: RE | Admit: 2016-09-20 | Discharge: 2016-09-20 | Disposition: A | Discharge status: Home / Self Care | Payer: Medicare Other | Source: Ambulatory Visit | Attending: Unknown Physician Specialty | Admitting: Unknown Physician Specialty

## 2016-09-20 DIAGNOSIS — I700 Atherosclerosis of aorta: Secondary | ICD-10-CM | POA: Diagnosis not present

## 2016-09-20 DIAGNOSIS — R0602 Shortness of breath: Secondary | ICD-10-CM

## 2016-09-20 DIAGNOSIS — R918 Other nonspecific abnormal finding of lung field: Secondary | ICD-10-CM | POA: Insufficient documentation

## 2016-09-20 DIAGNOSIS — R059 Cough, unspecified: Secondary | ICD-10-CM

## 2016-09-20 DIAGNOSIS — R05 Cough: Secondary | ICD-10-CM | POA: Insufficient documentation

## 2016-09-20 DIAGNOSIS — Z77090 Contact with and (suspected) exposure to asbestos: Secondary | ICD-10-CM

## 2016-09-20 LAB — POCT I-STAT CREATININE: Creatinine, Ser: 1 mg/dL (ref 0.61–1.24)

## 2016-09-20 MED ORDER — IOPAMIDOL (ISOVUE-300) INJECTION 61%
75.0000 mL | Freq: Once | INTRAVENOUS | Status: AC | PRN
  Administered 2016-09-20: 75 mL via INTRAVENOUS

## 2016-09-26 ENCOUNTER — Ambulatory Visit: Payer: Medicare Other

## 2016-11-27 ENCOUNTER — Other Ambulatory Visit: Payer: Self-pay | Admitting: Student

## 2016-11-27 DIAGNOSIS — M778 Other enthesopathies, not elsewhere classified: Secondary | ICD-10-CM

## 2016-11-27 DIAGNOSIS — M7581 Other shoulder lesions, right shoulder: Principal | ICD-10-CM

## 2016-12-08 ENCOUNTER — Ambulatory Visit: Payer: Medicare Other

## 2016-12-12 ENCOUNTER — Ambulatory Visit
Admission: RE | Admit: 2016-12-12 | Discharge: 2016-12-12 | Disposition: A | Discharge status: Home / Self Care | Payer: Medicare Other | Source: Ambulatory Visit | Attending: Student | Admitting: Student

## 2016-12-12 DIAGNOSIS — M7521 Bicipital tendinitis, right shoulder: Secondary | ICD-10-CM | POA: Insufficient documentation

## 2016-12-12 DIAGNOSIS — M7581 Other shoulder lesions, right shoulder: Secondary | ICD-10-CM | POA: Insufficient documentation

## 2016-12-12 DIAGNOSIS — M19011 Primary osteoarthritis, right shoulder: Secondary | ICD-10-CM | POA: Insufficient documentation

## 2016-12-12 DIAGNOSIS — M778 Other enthesopathies, not elsewhere classified: Secondary | ICD-10-CM

## 2016-12-12 DIAGNOSIS — S43431A Superior glenoid labrum lesion of right shoulder, initial encounter: Secondary | ICD-10-CM | POA: Diagnosis present

## 2016-12-12 MED ORDER — IOPAMIDOL (ISOVUE-200) INJECTION 41%
15.0000 mL | Freq: Once | INTRAVENOUS | Status: AC | PRN
  Administered 2016-12-12: 15 mL via INTRAVENOUS
  Filled 2016-12-12: qty 50

## 2016-12-12 MED ORDER — GADOBENATE DIMEGLUMINE 529 MG/ML IV SOLN
0.1000 mL | Freq: Once | INTRAVENOUS | Status: AC | PRN
  Administered 2016-12-12: 0.1 mL via INTRAVENOUS

## 2016-12-12 MED ORDER — LIDOCAINE HCL (PF) 1 % IJ SOLN
10.0000 mL | Freq: Once | INTRAMUSCULAR | Status: AC
  Administered 2016-12-12 (×2): 10 mL
  Filled 2016-12-12: qty 10

## 2016-12-28 ENCOUNTER — Emergency Department: Payer: Medicare Other

## 2016-12-28 ENCOUNTER — Emergency Department
Admission: EM | Admit: 2016-12-28 | Discharge: 2016-12-29 | Disposition: A | Discharge status: Home / Self Care | Payer: Medicare Other | Attending: Student in an Organized Health Care Education/Training Program | Admitting: Student in an Organized Health Care Education/Training Program

## 2016-12-28 DIAGNOSIS — R58 Hemorrhage, not elsewhere classified: Secondary | ICD-10-CM

## 2016-12-28 DIAGNOSIS — I10 Essential (primary) hypertension: Secondary | ICD-10-CM | POA: Diagnosis not present

## 2016-12-28 DIAGNOSIS — Z79899 Other long term (current) drug therapy: Secondary | ICD-10-CM | POA: Insufficient documentation

## 2016-12-28 DIAGNOSIS — Y990 Civilian activity done for income or pay: Secondary | ICD-10-CM | POA: Diagnosis not present

## 2016-12-28 DIAGNOSIS — S8012XA Contusion of left lower leg, initial encounter: Secondary | ICD-10-CM | POA: Diagnosis not present

## 2016-12-28 DIAGNOSIS — X503XXA Overexertion from repetitive movements, initial encounter: Secondary | ICD-10-CM | POA: Diagnosis not present

## 2016-12-28 DIAGNOSIS — Y929 Unspecified place or not applicable: Secondary | ICD-10-CM | POA: Insufficient documentation

## 2016-12-28 DIAGNOSIS — M79605 Pain in left leg: Secondary | ICD-10-CM | POA: Diagnosis present

## 2016-12-28 DIAGNOSIS — Y9301 Activity, walking, marching and hiking: Secondary | ICD-10-CM | POA: Diagnosis not present

## 2016-12-28 DIAGNOSIS — L03116 Cellulitis of left lower limb: Secondary | ICD-10-CM

## 2016-12-28 LAB — APTT: APTT: 34 s (ref 24–36)

## 2016-12-28 LAB — BASIC METABOLIC PANEL
ANION GAP: 10 (ref 5–15)
BUN: 26 mg/dL — ABNORMAL HIGH (ref 6–20)
CALCIUM: 9.3 mg/dL (ref 8.9–10.3)
CHLORIDE: 105 mmol/L (ref 101–111)
CO2: 23 mmol/L (ref 22–32)
Creatinine, Ser: 1.18 mg/dL (ref 0.61–1.24)
GFR calc non Af Amer: >60 mL/min (ref >60)
Glucose, Bld: 96 mg/dL (ref 65–99)
POTASSIUM: 3.8 mmol/L (ref 3.5–5.1)
Sodium: 138 mmol/L (ref 135–145)

## 2016-12-28 LAB — CBC WITH DIFFERENTIAL/PLATELET
Basophils Absolute: 0 10*3/uL (ref 0–0.1)
Basophils Relative: 0 %
EOS PCT: 5 %
Eosinophils Absolute: 0.5 10*3/uL (ref 0–0.7)
HEMATOCRIT: 43.8 % (ref 40.0–52.0)
HEMOGLOBIN: 14.7 g/dL (ref 13.0–18.0)
LYMPHS ABS: 3.7 10*3/uL — AB (ref 1.0–3.6)
LYMPHS PCT: 36 %
MCH: 30.5 pg (ref 26.0–34.0)
MCHC: 33.6 g/dL (ref 32.0–36.0)
MCV: 90.9 fL (ref 80.0–100.0)
Monocytes Absolute: 0.9 10*3/uL (ref 0.2–1.0)
Monocytes Relative: 8 %
NEUTROS ABS: 5.2 10*3/uL (ref 1.4–6.5)
Neutrophils Relative %: 51 %
Platelets: 305 10*3/uL (ref 150–440)
RBC: 4.82 MIL/uL (ref 4.40–5.90)
RDW: 12.6 % (ref 11.5–14.5)
WBC: 10.3 10*3/uL (ref 3.8–10.6)

## 2016-12-28 LAB — PROTIME-INR
INR: 0.94
Prothrombin Time: 12.5 seconds (ref 11.4–15.2)

## 2016-12-28 NOTE — ED Provider Notes (Signed)
[name] Emergency Department Provider Note    First MD Initiated Contact with Patient 12/28/16 2203     (approximate)  I have reviewed the triage vital signs and the nursing notes.   HISTORY  Chief Complaint Leg Pain    HPI Dennis Lawrence is a 66 y.o. 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  Patient been going up and down on scaffolding throughout the day.  Was initially concerned that he might of had some form of snakebite but does not recall seeing a snake and was primarily indoors for the most the day.  States he has noted bruising that swelling so he came to be evaluated.  Says the pain is mild in severity.  No known trauma.  No history of bleeding disorders.  He is not on any blood thinners.  No other associated pain or symptoms.  Past Medical History:  Diagnosis Date  . Arthritis    HANDS  . Back pain    CHRONIC, DEGENERATION OF LUMBAR OR LUMBOSACRAL INTERVERTEBRAL DISC  . H/O seasonal allergies   . History of positive PPD    NORMAL CHEST XRAY 09/1998  . Hyperlipidemia   . Hypertension   . Neuromuscular disorder (HCC)    NEURALGIA AND NEURITIS  . Restless leg syndrome   . 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 Asthma Mother   . Colon cancer Brother   . Stroke Brother    Past Surgical History:  Procedure Laterality Date  . ANKLE ARTHROSCOPY Right 11/11/2015   Procedure: ANKLE ARTHROSCOPY/OCD REPAIR;  Surgeon: Samara Deist, DPM;  Location: [place];  Service: Podiatry;  Laterality: Right;  POPLITEAL  . ANTERIOR TALOFIBULAR LIGAMENT REPAIR Right 11/11/2015   Procedure: ANKLE BROSTROM-GOULD PROCEDURE;  Surgeon: Samara Deist, DPM;  Location: [place];  Service: Podiatry;  Laterality: Right;  . BACK SURGERY     L5-S1  DISCECTOMY, NUMBNESS LEFT BIG TOE  . COLONOSCOPY    . FUNCTIONAL ENDOSCOPIC SINUS SURGERY    . TENDON REPAIR Right 11/11/2015   Procedure: FLEXOR TENDON REPAIR-SECOND, TENOLYSIS-SINGLE;  Surgeon: Samara Deist, DPM;  Location: [place];  Service: Podiatry;  Laterality: Right;   There are no active problems to display for this patient.     Prior to Admission medications   Medication Sig Start Date End Date Taking? Authorizing Provider  acetaminophen (TYLENOL) 500 MG tablet Take 1,000 mg by mouth every 6 (six) hours as needed.    [provider]  ascorbic acid (VITAMIN C) 1000 MG tablet Take 1,000 mg by mouth daily.    [provider]  celecoxib (CELEBREX) 200 MG capsule Take 200 mg by mouth 2 (two) times daily. AM AND PM    [provider]  cyclobenzaprine (FLEXERIL) 10 MG tablet Take 10 mg by mouth 3 (three) times daily as needed for muscle spasms.    [provider]  Glucosamine-Chondroitin 250-200 MG CAPS Take by mouth.    [provider]  Multiple Vitamin (MULTIVITAMIN) tablet Take 1 tablet by mouth daily.    [provider]  oxyCODONE-acetaminophen (ROXICET) 5-325 MG tablet Take 1-2 tablets by mouth every 4 (four) hours as needed for severe pain. 11/11/15   Samara Deist, DPM  oxyCODONE-acetaminophen (ROXICET) 5-325 MG tablet Take  1 tablet by mouth every 6 (six) hours as needed for moderate pain. 03/31/16   Sable Feil, PA-C  pramipexole (MIRAPEX) 1 MG tablet Take 1 mg by mouth at bedtime.     [provider]  traMADol (ULTRAM) 50 MG tablet Take 1 tablet (50 mg total) by mouth 2 (two) times daily. Patient not taking: Reported on 11/04/2015 07/08/14   Menshew, Dannielle Karvonen, PA-C    Allergies Patient has no known allergies.    Social History Social History   Tobacco Use  . Smoking status: Never Smoker  . Smokeless tobacco: Never Used  Substance Use Topics  . Alcohol use: No  . Drug use: No     Review of Systems Patient denies headaches, rhinorrhea, blurry vision, numbness, shortness of breath, chest pain, edema, cough, abdominal pain, nausea, vomiting, diarrhea, dysuria, fevers, rashes or hallucinations unless otherwise stated above in HPI. ____________________________________________   PHYSICAL EXAM:  VITAL SIGNS: Vitals:   12/28/16 2041 12/28/16 2215  BP: (!) 170/82 105/68  Pulse: 69 (!) 57  Resp: 18 14  Temp: 98.5 F (36.9 C)   SpO2: 97% 93%    Constitutional: Alert and oriented. Well appearing and in no acute distress. Eyes: Conjunctivae are normal.  Head: Atraumatic. Nose: No congestion/rhinnorhea. Mouth/Throat: Mucous membranes are moist.   Neck: No stridor. Painless ROM.  Cardiovascular: Normal rate, regular rhythm. Grossly normal heart sounds.  Good peripheral circulation. Respiratory: Normal respiratory effort.  No retractions. Lungs CTAB. Gastrointestinal: Soft and nontender. No distention. No abdominal bruits. No CVA tenderness. Genitourinary:  Musculoskeletal: A 5 cm in diameter ecchymosis of the medial knee just overlying the medial tibial plateau.  There is no bony tenderness to palpation.  No joint effusion.  There is no evidence of crepitus.  Lower extremity compartments are soft.  Good distal cap refill.  No petechia..  No joint effusions. Neurologic:  Normal speech and language. No gross focal neurologic deficits are appreciated. No facial droop Skin:  Skin is warm, dry and intact. No rash noted. Psychiatric: Mood and affect are normal. Speech and behavior are normal.  ____________________________________________   LABS (all labs ordered are listed, but only abnormal results are displayed)  No results found for this or any previous visit (from the past 24 hour(s)). ____________________________________________ ____________________________________________  [id number]  I personally reviewed all radiographic images ordered to evaluate for the  above acute complaints and reviewed radiology reports and findings.  These findings were personally discussed with the patient.  Please see medical record for radiology report.  ____________________________________________   PROCEDURES  Procedure(s) performed:  Procedures    Critical Care performed: no ____________________________________________   INITIAL IMPRESSION / ASSESSMENT AND PLAN / ED COURSE  Pertinent labs & imaging results that were available during my care of the patient were reviewed by me and considered in my medical decision making (see chart for details).  DDX: contusion, fracture, snake bite, dvt  Dennis Lawrence is a 66 y.o. who presents to the ED with bruise and pain to the medial knee as described above.  Patient in no acute distress.  This not clinically consistent with snakebite.  No evidence of other spontaneous hemorrhage.  Due to the spontaneous bruising with no recollection of trauma x-ray was ordered as well as will order ultrasound to evaluate for evidence of DVT as the patient is also complaining of swelling to the left leg.  Blood work will be ordered to make sure the patient does not have any evidence of thrombocytopenia.  Patient otherwise He[place] dynamically stable.  Patient will be signed out to oncoming physician.  Anticipate discharge pending results.      ____________________________________________   FINAL CLINICAL IMPRESSION(S) / ED DIAGNOSES  Final diagnoses:  Ecchymosis  Acute pain of left knee      NEW MEDICATIONS STARTED DURING THIS VISIT:  This SmartLink is deprecated. Use AVSMEDLIST instead to display the medication list for a patient.   Note:  This document was prepared using Dragon voice recognition software and may include unintentional dictation errors.    Merlyn Lot, MD 12/28/16 [phone]

## 2016-12-28 NOTE — ED Triage Notes (Signed)
Patient reports possible snake bite today. 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 Patient reports area of redness/bruisng with what he believes are fang marks.

## 2016-12-28 NOTE — Discharge Instructions (Addendum)
Follow-up with PCP return to the ER for any worsening symptoms or for any additional concerns or questions.

## 2016-12-29 DIAGNOSIS — S8012XA Contusion of left lower leg, initial encounter: Secondary | ICD-10-CM | POA: Diagnosis not present

## 2016-12-29 MED ORDER — DEXTROSE 5 % IV SOLN
2.0000 g | Freq: Once | INTRAVENOUS | Status: AC
  Administered 2016-12-29: 2 g via INTRAVENOUS
  Filled 2016-12-29: qty 2

## 2016-12-29 MED ORDER — CEPHALEXIN 500 MG PO CAPS: 500.0000 mg | ORAL_CAPSULE | Freq: Two times a day (BID) | ORAL | 0 refills | Status: AC

## 2016-12-29 NOTE — ED Provider Notes (Signed)
I assumed care of the patient from Dr. Quentin Cornwall at 11:00 PM.  Ultrasound of the left lower extremity revealed subcutaneous hematoma/edema.  On evaluation the patient has a 5 cm area medial to the left knee with blanching erythema with surrounding marked ecchymoses.  Central portions very tender to touch.  Suspect possible cellulitis most likely secondary to insect bite as per the patient's history.  As such the patient given 2 g ceftriaxone IV and will be discharged home with Keflex.  Spoke with the patient at length regarding warning signs that would warrant return to the emergency department.  Patient is advised to return in 24 hours for repeat evaluation.   Gregor Hams, MD 12/29/16 [phone]

## 2016-12-29 NOTE — ED Notes (Signed)
MD approved reg diet for patient. Pt provided meal tray by RN

## 2017-12-28 ENCOUNTER — Other Ambulatory Visit: Payer: Self-pay | Admitting: Surgery

## 2017-12-28 DIAGNOSIS — M7581 Other shoulder lesions, right shoulder: Secondary | ICD-10-CM

## 2018-01-15 ENCOUNTER — Ambulatory Visit: Admission: RE | Admit: 2018-01-15 | Payer: Medicare Other | Source: Ambulatory Visit

## 2018-01-15 ENCOUNTER — Other Ambulatory Visit: Payer: Medicare Other

## 2018-01-30 ENCOUNTER — Ambulatory Visit
Admission: RE | Admit: 2018-01-30 | Discharge: 2018-01-30 | Disposition: A | Discharge status: Home / Self Care | Payer: Medicare Other | Source: Ambulatory Visit | Attending: Surgery | Admitting: Surgery

## 2018-01-30 DIAGNOSIS — M7581 Other shoulder lesions, right shoulder: Secondary | ICD-10-CM | POA: Insufficient documentation

## 2018-08-26 ENCOUNTER — Encounter: Payer: Self-pay | Admitting: *Deleted

## 2018-12-05 IMAGING — RF DG FLUORO GUIDE NDL PLC/BX
1 series · 1 of 1 positions shown · non-contrast
Comparison: none

CLINICAL DATA: Chronic right shoulder pain without known injury.
History of subacromial tendinitis of the right shoulder.

[Series 2: cp_standard · 0.17mm/px · 1 of 1 slices shown]
[im 1/1]
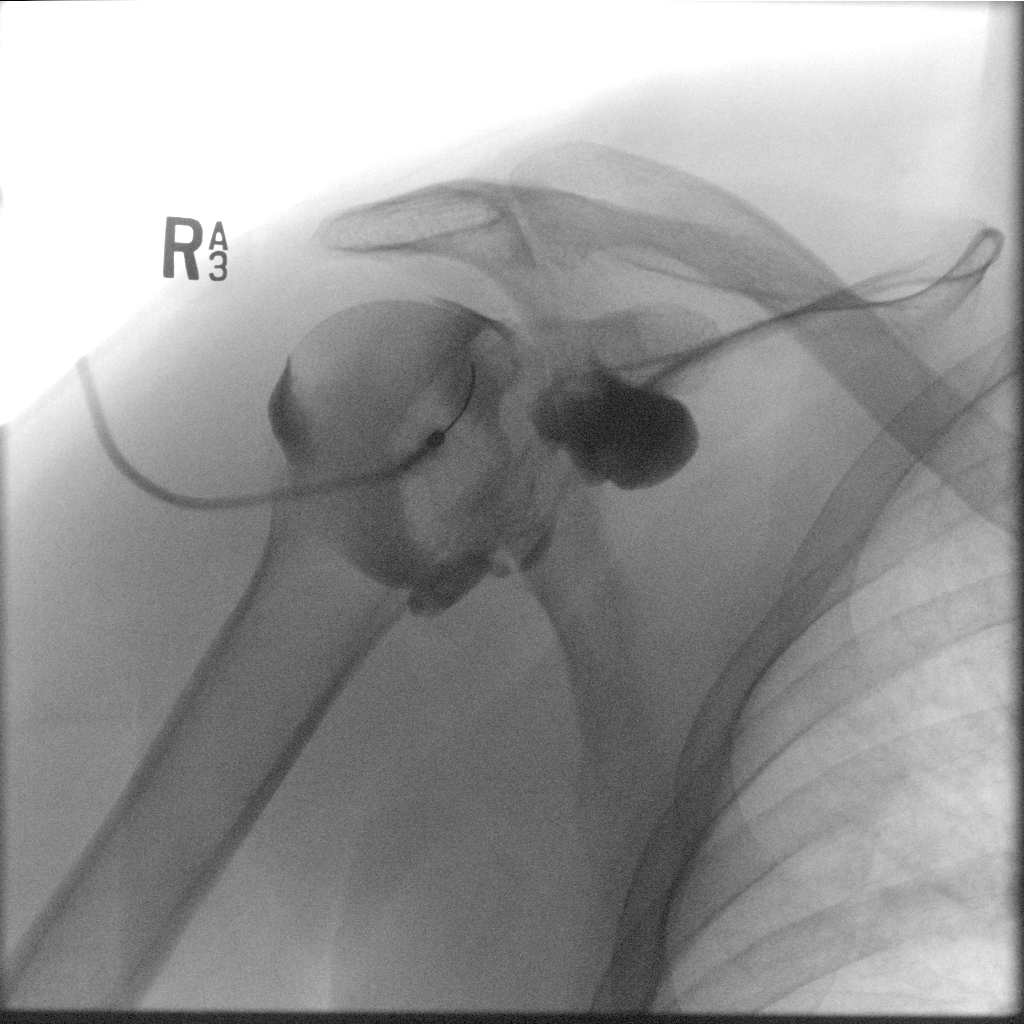

[1 of 1 positions shown; findings below may reference images not displayed]

EXAM:
Right shoulder INJECTION UNDER FLUOROSCOPY

FLUOROSCOPY TIME:  Fluoroscopy Time:  0 minutes, 48 seconds

Radiation Exposure Index (if provided by the fluoroscopic device):
152 micro Gy per meter square

Number of Acquired Spot Images: 1 screen save image

PROCEDURE:
The anticipated procedure was discussed with the patient. Risks
including bleeding, infection, and increased pain were discussed and
their management outlined. There was no contraindication to the use
of cutaneous antiseptics, injectable lidocaine, or injectable
contrast agents. The patient signed informed consent. A time-out
procedure was called.

The overlying skin was marked with an indelible marker and prepped
with Betadine, draped in the usual sterile fashion, and infiltrated
locally with 5 cc of Lidocaine. Curved 22 gauge spinal needle
advanced to the superomedial margin of the right humeral head.
Diagnostic injection of iodinated contrastd emonstrates
intra-articular spread without intravascular component.

A solution of 12 cc of 0.1 cc of MultiHance, 5 cc of 1% lidocaine,
and 7 cc of Isovue 200 was then instilled into the joint space. A
screen save image was obtained and the needle removed.
IMPRESSION: The patient tolerated the procedure well and was taken to the MRI
suite in good condition.

Technically successful right shoulder injection under fluoroscopy
prior to MRI.

## 2018-12-21 IMAGING — DX DG KNEE COMPLETE 4+V*L*
4 series · 4 of 4 positions shown · non-contrast
Comparison: None.

CLINICAL DATA: Swelling and bruising to the medial proximal tibial
area.

EXAM:
LEFT KNEE - COMPLETE 4+ VIEW

[knee ap]
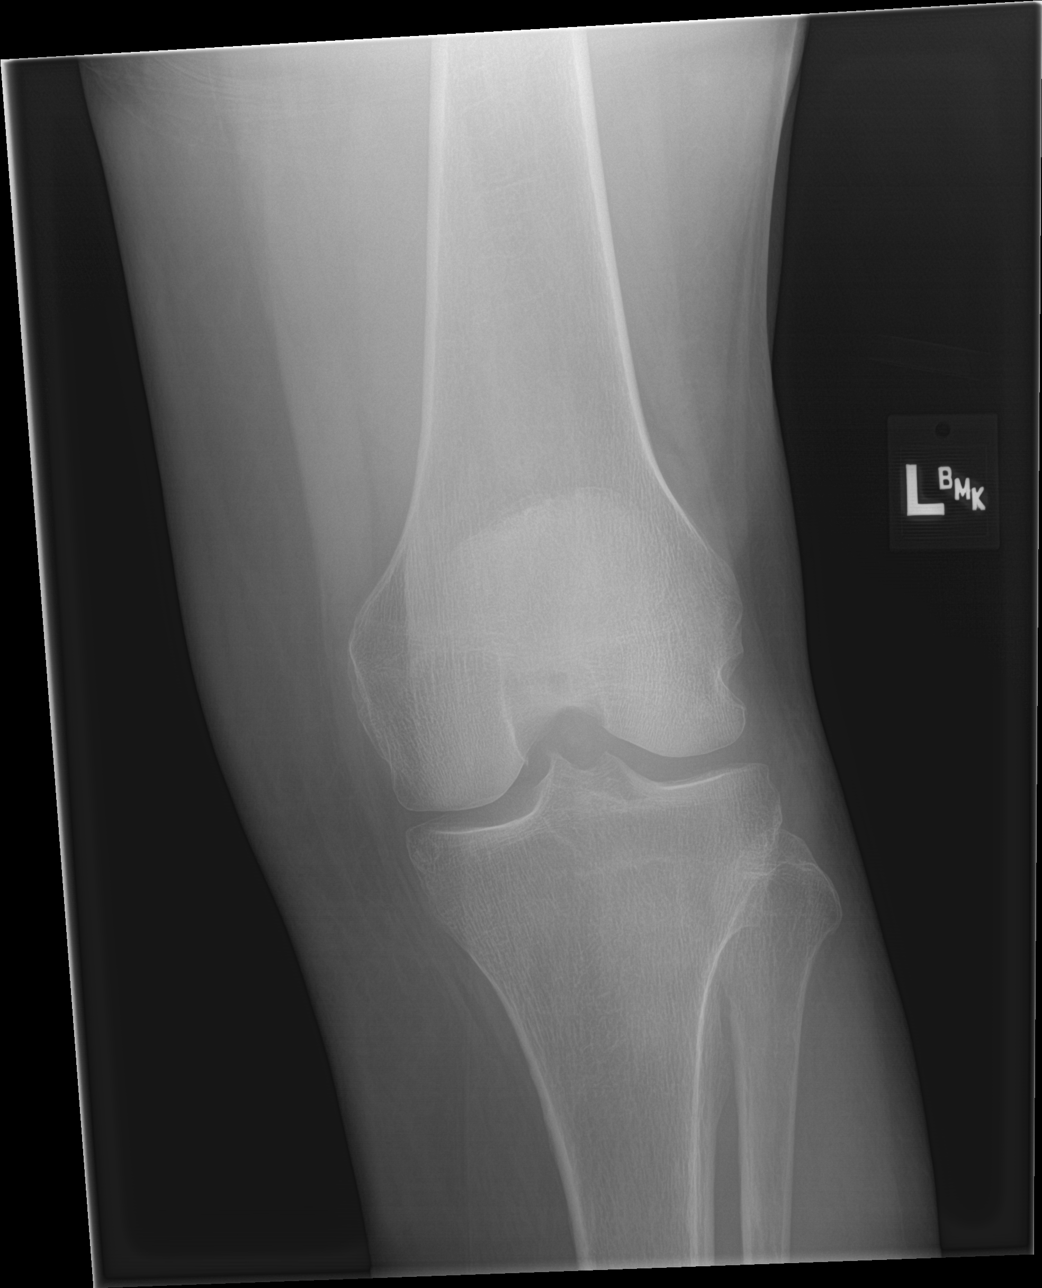

[knee lat]
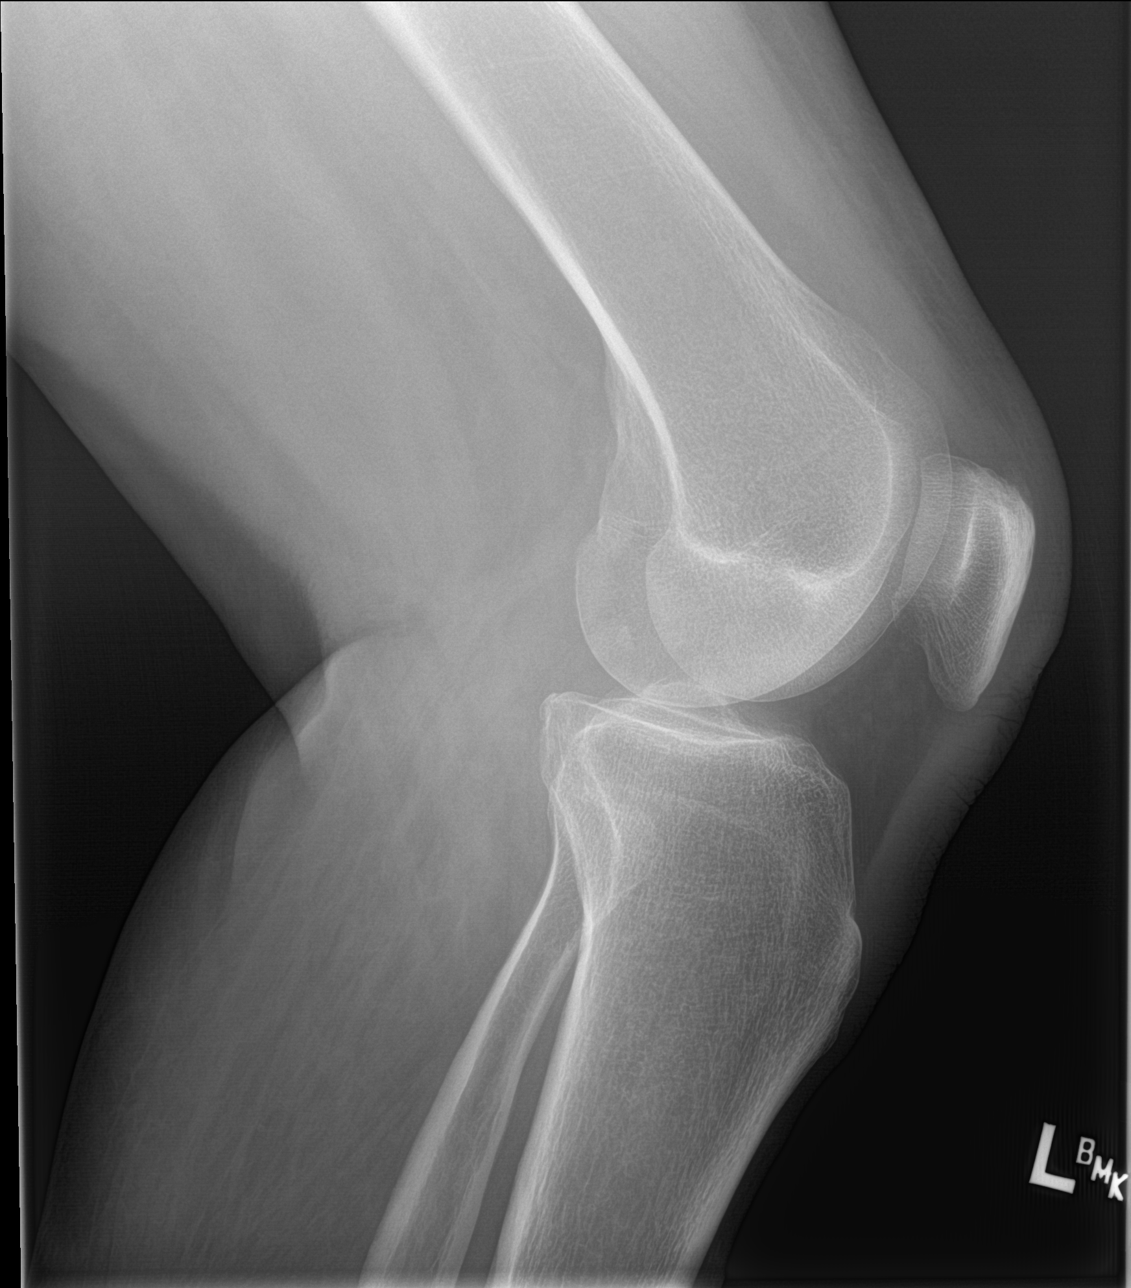

[knee obl (1 of 2)]
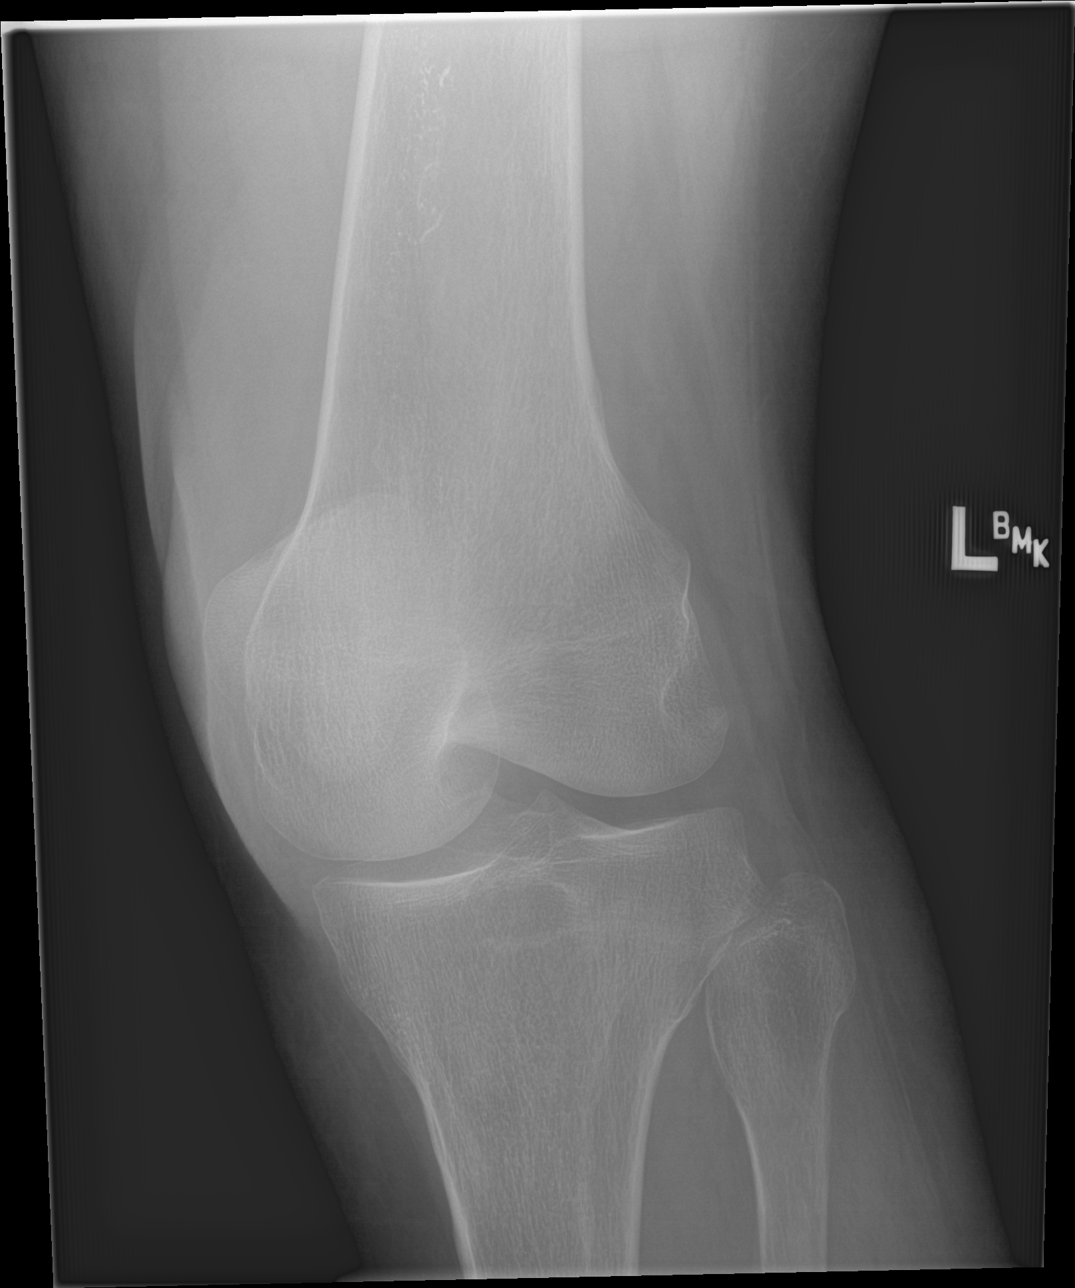

[knee obl (2 of 2)]
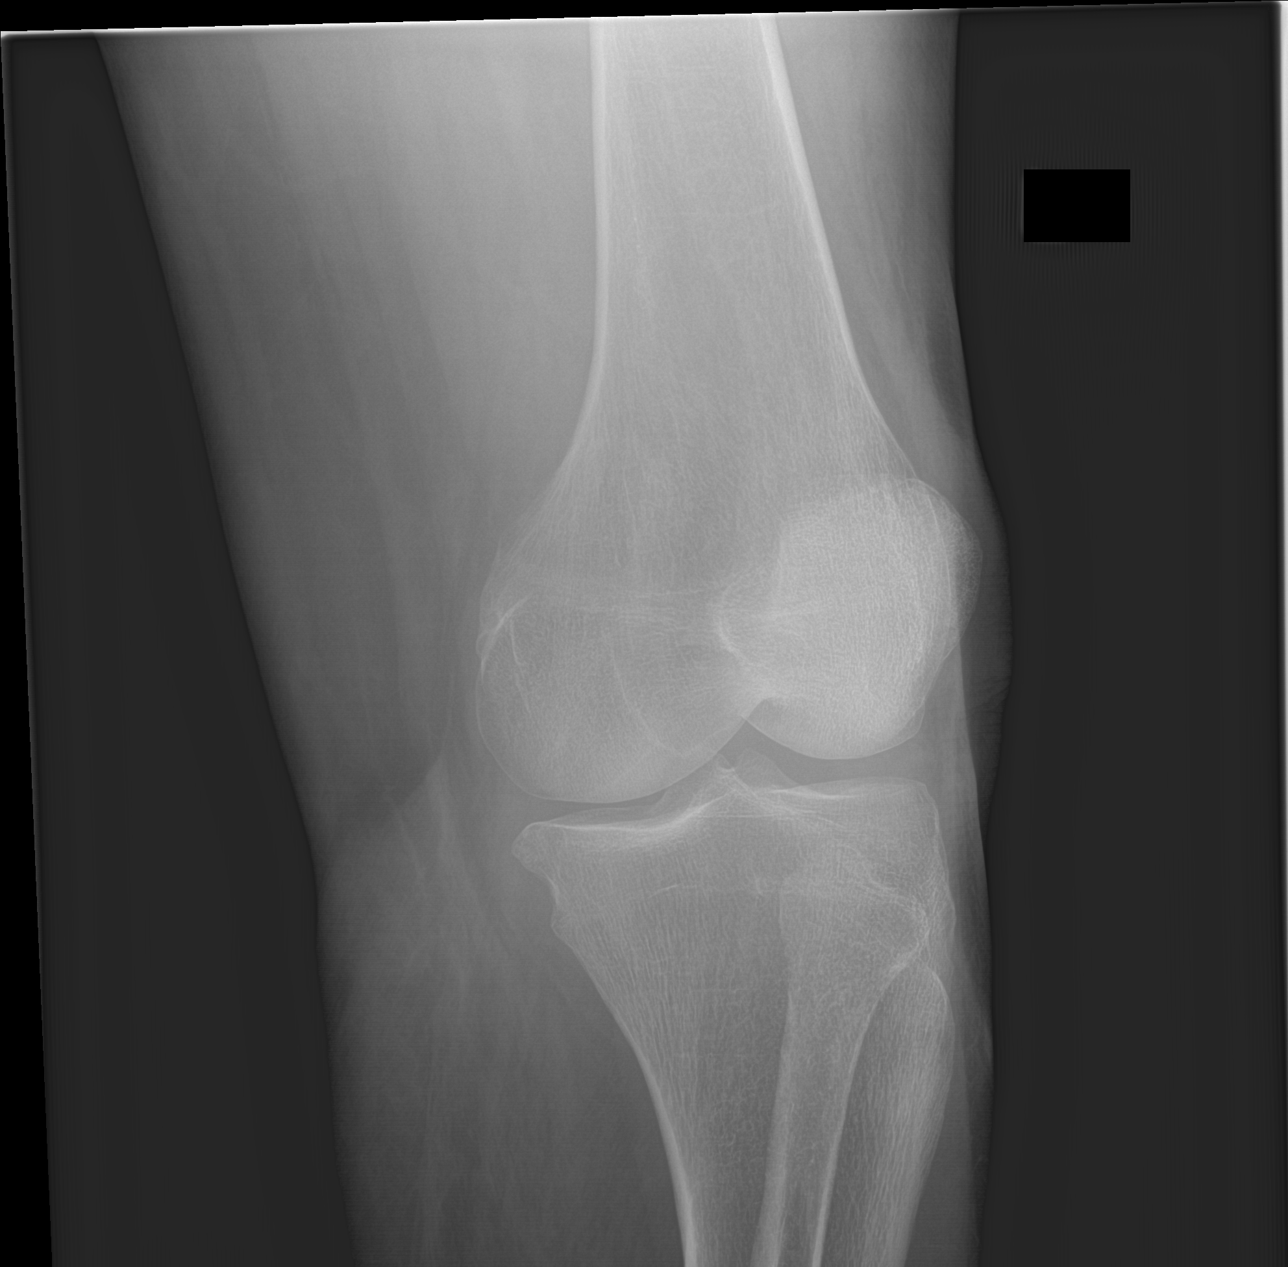

[4 of 4 positions shown; findings below may reference images not displayed]

FINDINGS: No evidence of fracture, dislocation, or joint effusion. No evidence
of arthropathy or other focal bone abnormality. Medial soft tissue
swelling.
IMPRESSION: No acute fracture or dislocation identified about the left knee.

## 2019-03-03 ENCOUNTER — Ambulatory Visit: Payer: Medicare Other

## 2020-02-16 ENCOUNTER — Other Ambulatory Visit: Payer: Self-pay | Admitting: Student

## 2020-02-16 DIAGNOSIS — M75111 Incomplete rotator cuff tear or rupture of right shoulder, not specified as traumatic: Secondary | ICD-10-CM

## 2020-02-16 DIAGNOSIS — M7581 Other shoulder lesions, right shoulder: Secondary | ICD-10-CM

## 2020-02-16 DIAGNOSIS — M7582 Other shoulder lesions, left shoulder: Secondary | ICD-10-CM

## 2020-02-22 ENCOUNTER — Ambulatory Visit
Admission: RE | Admit: 2020-02-22 | Discharge: 2020-02-22 | Disposition: A | Discharge status: Home / Self Care | Payer: Medicare Other | Source: Ambulatory Visit | Attending: Student | Admitting: Student

## 2020-02-22 ENCOUNTER — Other Ambulatory Visit: Payer: Self-pay

## 2020-02-22 DIAGNOSIS — M7582 Other shoulder lesions, left shoulder: Secondary | ICD-10-CM | POA: Diagnosis present

## 2020-02-22 DIAGNOSIS — M7581 Other shoulder lesions, right shoulder: Secondary | ICD-10-CM | POA: Insufficient documentation

## 2020-02-22 DIAGNOSIS — M75111 Incomplete rotator cuff tear or rupture of right shoulder, not specified as traumatic: Secondary | ICD-10-CM | POA: Diagnosis present

## 2020-11-02 DIAGNOSIS — I441 Atrioventricular block, second degree: Secondary | ICD-10-CM

## 2020-11-30 ENCOUNTER — Ambulatory Visit: Admit: 2020-11-30 | Payer: Medicare Other | Admitting: Cardiology

## 2020-11-30 DIAGNOSIS — I441 Atrioventricular block, second degree: Secondary | ICD-10-CM

## 2020-11-30 SURGERY — PACEMAKER LEADLESS INSERTION
Anesthesia: Moderate Sedation

## 2021-03-28 ENCOUNTER — Encounter: Payer: Self-pay | Admitting: *Deleted

## 2021-04-27 ENCOUNTER — Other Ambulatory Visit: Payer: Self-pay | Admitting: Unknown Physician Specialty

## 2021-04-27 ENCOUNTER — Ambulatory Visit
Admission: RE | Admit: 2021-04-27 | Discharge: 2021-04-27 | Disposition: A | Discharge status: Home / Self Care | Payer: Medicare Other | Source: Ambulatory Visit | Attending: Unknown Physician Specialty | Admitting: Unknown Physician Specialty

## 2021-04-27 DIAGNOSIS — R059 Cough, unspecified: Secondary | ICD-10-CM

## 2021-04-28 ENCOUNTER — Ambulatory Visit (INDEPENDENT_AMBULATORY_CARE_PROVIDER_SITE_OTHER): Payer: Medicare Other | Admitting: Urology

## 2021-04-28 ENCOUNTER — Encounter: Payer: Self-pay | Admitting: Urology

## 2021-04-28 VITALS — BP 129/81 | HR 69 | Ht 77.0 in | Wt 225.0 lb

## 2021-04-28 DIAGNOSIS — R972 Elevated prostate specific antigen [PSA]: Secondary | ICD-10-CM | POA: Diagnosis not present

## 2021-04-28 NOTE — Progress Notes (Signed)
? ?04/28/2021 ?12:08 PM  ? ?Dennis Lawrence ?07-18-1950 ?[id number] ? ?Referring provider: Juluis Pitch, MD ?[address] ?[place],  [place] [address] ? ?Chief Complaint  ?Patient presents with  ? Elevated PSA  ? ? ?HPI: ?Dennis Lawrence is a 71 y.o. male referred for evaluation of an elevated PSA. ? ?PSA 03/11/2021 elevated at 6.01 ?Was repeated 03/17/2021 with % free ordered.  Repeat total PSA was 11 and % free PSA 11.3 ?No bothersome LUTS ?Denies dysuria, gross hematuria ?Baseline nocturia x1 which is not bothersome ?No family history prostate cancer ?Baseline PSA: ? ? ? ?PMH: ?Past Medical History:  ?Diagnosis Date  ? Arthritis   ? HANDS  ? Atypical nevus 09/14/2014  ? left lower back-mild  ? Back pain   ? CHRONIC, DEGENERATION OF LUMBAR OR LUMBOSACRAL INTERVERTEBRAL DISC  ? H/O seasonal allergies   ? History of positive PPD   ? NORMAL CHEST XRAY 09/1998  ? Hyperlipidemia   ? Hypertension   ? Neuromuscular disorder ([place])   ? NEURALGIA AND NEURITIS  ? Restless leg syndrome   ? Tendon tear   ? 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 ? ?Surgical History: ?Past Surgical History:  ?Procedure Laterality Date  ? ANKLE ARTHROSCOPY Right 11/11/2015  ? Procedure: ANKLE ARTHROSCOPY/OCD REPAIR;  Surgeon: Samara Deist, DPM;  Location: [facility];  Service: Podiatry;  Laterality: Right;  POPLITEAL  ? ANTERIOR TALOFIBULAR LIGAMENT REPAIR Right 11/11/2015  ? Procedure: ANKLE BROSTROM-GOULD PROCEDURE;  Surgeon: Samara Deist, DPM;  Location: [facility];  Service: Podiatry;  Laterality: Right;  ? BACK SURGERY    ? L5-S1 DISCECTOMY, NUMBNESS LEFT BIG TOE  ? COLONOSCOPY    ? FUNCTIONAL ENDOSCOPIC SINUS SURGERY    ? TENDON REPAIR Right 11/11/2015  ? Procedure: FLEXOR TENDON REPAIR-SECOND, TENOLYSIS-SINGLE;  Surgeon: Samara Deist, DPM;  Location: [facility];  Service: Podiatry;  Laterality: Right;  ? ? ?Home Medications:  ?Allergies as of 04/28/2021   ?No Known Allergies ?  ? ?   ?Medication List  ?  ? ?  ? Accurate as of April 28, 2021 12:08 PM. If you have any questions, ask your nurse or doctor.  ?  ?  ? ?  ? ?acetaminophen 500 MG tablet ?Commonly known as: TYLENOL ?Take 1,000 mg by mouth every 6 (six) hours as needed. ?  ?ascorbic acid 1000 MG tablet ?Commonly known as: VITAMIN C ?Take 1,000 mg by mouth daily. ?  ?celecoxib 200 MG capsule ?Commonly known as: CELEBREX ?Take 200 mg by mouth 2 (two) times daily. AM AND PM ?  ?cyclobenzaprine 10 MG tablet ?Commonly known as: FLEXERIL ?Take 10 mg by mouth 3 (three) times daily as needed for muscle spasms. ?  ?Glucosamine-Chondroitin 250-200 MG Caps ?Take by mouth. ?  ?multivitamin tablet ?Take 1 tablet by mouth daily. ?  ?oxyCODONE-acetaminophen 5-325 MG tablet ?Commonly known as: Roxicet ?Take 1-2 tablets by mouth every 4 (four) hours as needed for severe pain. ?  ?oxyCODONE-acetaminophen 5-325 MG tablet ?Commonly known as: Roxicet ?Take 1 tablet by mouth every 6 (six) hours as needed for moderate pain. ?  ?pramipexole 1 MG tablet ?Commonly known as: MIRAPEX ?Take 1 mg by mouth at bedtime. ?  ?traMADol 50 MG tablet ?Commonly known as: Ultram ?Take 1 tablet (50 mg total) by mouth 2 (two) times daily. ?  ? ?  ? ? ?Allergies: No Known Allergies ? ?Family History: ?Family History  ?Problem Relation Age of Onset  ?  Asthma Mother   ? Colon cancer Brother   ? Stroke Brother   ? ? ?Social History:  reports that he has never smoked. He has never used smokeless tobacco. He reports that he does not drink alcohol and does not use drugs. ? ? ?Physical Exam: ?BP 129/81   Pulse 69   Ht '6\' 5"'$  (1.956 m)   Wt 225 lb (102.1 kg)   BMI 26.68 kg/m?   ?Constitutional:  Alert and oriented, No acute distress. ?HEENT: [place] AT, moist mucus membranes.  Trachea midline, no masses. ?Cardiovascular: No clubbing, cyanosis, or edema. ?Respiratory: Normal respiratory effort, no increased work of breathing. ?GI: Abdomen is soft, nontender, nondistended, no abdominal  masses ?GU: Prostate 60+ cc, smooth without nodules ?Skin: No rashes, bruises or suspicious lesions. ?Neurologic: Grossly intact, no focal deficits, moving all 4 extremities. ?Psychiatric: Normal mood and affect. ? ? ?Assessment & Plan:   ? ?1. Elevated PSA ?Benign DRE ?Although PSA is a prostate cancer screening test he was informed that cancer is not the most common cause of an elevated PSA. Other potential causes including BPH and inflammation were discussed. He was informed that the only way to adequately diagnose prostate cancer would be a transrectal ultrasound and biopsy of the prostate. The procedure was discussed including potential risks of bleeding and infection/sepsis. He was also informed that a negative biopsy does not conclusively rule out the possibility that prostate cancer may be present and that continued monitoring is required. The use of multiparametric prostate MRI to identify lesions suspicious for high-grade prostate cancer and aid in targeted biopsy was reviewed. Continued periodic surveillance was also discussed. ?We discussed that free PSA percentage was originally developed in hopes to avoid the number of prostate biopsies and is not very useful in present day ?Mark PSA rise in the 1 week between 2/17 and 2/23 which most likely represents transient elevation ?Recommend repeating the PSA today.  If it returns to baseline then he can continue annual PSA monitoring.  If it remains elevated recommend scheduling prostate MRI ? ? ?Abbie Sons, MD ? ?[facility] ?[address], [address] ?[place], [place] [address] ?[phone] ?

## 2021-04-29 LAB — PSA: Prostate Specific Ag, Serum: 5.8 ng/mL — ABNORMAL HIGH (ref 0.0–4.0)

## 2021-05-01 ENCOUNTER — Other Ambulatory Visit: Payer: Self-pay | Admitting: Urology

## 2021-05-01 DIAGNOSIS — R972 Elevated prostate specific antigen [PSA]: Secondary | ICD-10-CM

## 2021-05-02 ENCOUNTER — Encounter: Payer: Self-pay | Admitting: *Deleted

## 2021-06-06 ENCOUNTER — Ambulatory Visit (HOSPITAL_COMMUNITY)
Admission: RE | Admit: 2021-06-06 | Discharge: 2021-06-06 | Disposition: A | Discharge status: Home / Self Care | Payer: Medicare Other | Source: Ambulatory Visit | Attending: Urology | Admitting: Urology

## 2021-06-06 ENCOUNTER — Encounter (HOSPITAL_COMMUNITY): Payer: Self-pay

## 2021-06-06 DIAGNOSIS — R972 Elevated prostate specific antigen [PSA]: Secondary | ICD-10-CM

## 2021-07-13 ENCOUNTER — Ambulatory Visit (HOSPITAL_COMMUNITY)
Admission: RE | Admit: 2021-07-13 | Discharge: 2021-07-13 | Disposition: A | Discharge status: Home / Self Care | Payer: Medicare Other | Source: Ambulatory Visit | Attending: Urology | Admitting: Urology

## 2021-07-13 ENCOUNTER — Ambulatory Visit (HOSPITAL_COMMUNITY): Payer: Medicare Other

## 2021-07-13 DIAGNOSIS — R972 Elevated prostate specific antigen [PSA]: Secondary | ICD-10-CM | POA: Insufficient documentation

## 2021-07-13 MED ORDER — GADOBUTROL 1 MMOL/ML IV SOLN
10.0000 mL | Freq: Once | INTRAVENOUS | Status: AC | PRN
  Administered 2021-07-13: 10 mL via INTRAVENOUS

## 2021-07-16 ENCOUNTER — Encounter: Payer: Self-pay | Admitting: Urology

## 2021-07-18 ENCOUNTER — Other Ambulatory Visit: Payer: Self-pay | Admitting: Physician Assistant

## 2021-07-18 DIAGNOSIS — M5416 Radiculopathy, lumbar region: Secondary | ICD-10-CM

## 2021-07-22 ENCOUNTER — Other Ambulatory Visit (HOSPITAL_COMMUNITY): Payer: Medicare Other

## 2021-07-25 ENCOUNTER — Telehealth: Payer: Self-pay | Admitting: Urology

## 2021-07-25 NOTE — Telephone Encounter (Signed)
I sent patient a MyChart message regarding his prostate MRI on 6/24.  Please see if he has any questions and if he desires to proceed with biopsy.

## 2021-07-27 NOTE — Telephone Encounter (Signed)
Left message for patient to return the call.

## 2021-08-01 ENCOUNTER — Telehealth: Payer: Self-pay | Admitting: Urology

## 2021-08-01 ENCOUNTER — Encounter: Payer: Self-pay | Admitting: *Deleted

## 2021-08-01 NOTE — Telephone Encounter (Signed)
Patient has not responded to MyChart message or telephone encounter regarding biopsy scheduling.  Recommend scheduling a follow-up appointment to discuss further.  This can be with a PA if needed

## 2021-08-31 ENCOUNTER — Encounter: Payer: Self-pay | Admitting: Urology

## 2021-08-31 ENCOUNTER — Ambulatory Visit (INDEPENDENT_AMBULATORY_CARE_PROVIDER_SITE_OTHER): Payer: Medicare Other | Admitting: Urology

## 2021-08-31 ENCOUNTER — Ambulatory Visit
Admission: RE | Admit: 2021-08-31 | Discharge: 2021-08-31 | Disposition: A | Discharge status: Home / Self Care | Payer: Medicare Other | Source: Ambulatory Visit | Attending: Physician Assistant | Admitting: Physician Assistant

## 2021-08-31 VITALS — BP 169/91 | HR 57 | Ht 77.0 in | Wt 229.0 lb

## 2021-08-31 DIAGNOSIS — M5416 Radiculopathy, lumbar region: Secondary | ICD-10-CM | POA: Diagnosis present

## 2021-08-31 DIAGNOSIS — R972 Elevated prostate specific antigen [PSA]: Secondary | ICD-10-CM

## 2021-08-31 NOTE — Progress Notes (Signed)
08/31/2021 8:19 AM   Dennis Lawrence 03/09/1950 [id number]  Referring provider: Juluis Pitch, MD [phone] [place],  [place] [address]  Chief Complaint  Patient presents with   Elevated PSA    HPI: 70 y.o. male referred for follow-up of an elevated PSA  PSA 03/11/2021 elevated at 6.01 Was repeated 03/17/2021 with % free ordered.  Repeat total PSA was 11 and % free PSA 11.3 Follow-up PSA April 2023 was 5.8 Prostate MRI 07/13/2021 with a 66 g gland and PI-RADS 3 lesion left anterior transition zone.  There were findings consistent with prior prostatic inflammation He was sent a MyChart message regarding options of fusion biopsy versus surveillance.  He did not receive the message and was asked to come in to discuss these options. 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, RIGHT ANKLE    Surgical History: Past Surgical History:  Procedure Laterality Date   ANKLE ARTHROSCOPY Right 11/11/2015   Procedure: ANKLE ARTHROSCOPY/OCD REPAIR;  Surgeon: Samara Deist, DPM;  Location: [facility];  Service: Podiatry;  Laterality: Right;  POPLITEAL   ANTERIOR TALOFIBULAR LIGAMENT REPAIR Right 11/11/2015   Procedure: ANKLE BROSTROM-GOULD PROCEDURE;  Surgeon: Samara Deist, DPM;  Location: [facility];  Service: Podiatry;  Laterality: Right;   BACK SURGERY     L5-S1 DISCECTOMY, NUMBNESS LEFT BIG TOE   COLONOSCOPY      FUNCTIONAL ENDOSCOPIC SINUS SURGERY     TENDON REPAIR Right 11/11/2015   Procedure: FLEXOR TENDON REPAIR-SECOND, TENOLYSIS-SINGLE;  Surgeon: Samara Deist, DPM;  Location: [facility];  Service: Podiatry;  Laterality: Right;    Home Medications:  Allergies as of 08/31/2021   No Known Allergies      Medication List        Accurate as of August 31, 2021  8:19 AM. If you have any questions, ask your nurse or doctor.          acetaminophen 500 MG tablet Commonly known as: TYLENOL Take 1,000 mg by mouth every 6 (six) hours as needed.   ascorbic acid 1000 MG tablet Commonly known as: VITAMIN C Take 1,000 mg by mouth daily.   celecoxib 200 MG capsule Commonly known as: CELEBREX Take 200 mg by mouth 2 (two) times daily. AM AND PM   cyclobenzaprine 10 MG tablet Commonly known as: FLEXERIL Take 10 mg by mouth 3 (three) times daily as needed for muscle spasms.   Glucosamine-Chondroitin 250-200 MG Caps Take by mouth.   multivitamin tablet Take 1 tablet by mouth daily.   oxyCODONE-acetaminophen 5-325 MG tablet Commonly known as: Roxicet Take 1-2 tablets by mouth every 4 (four) hours as needed for severe pain.   oxyCODONE-acetaminophen 5-325 MG tablet Commonly known as: Roxicet Take 1 tablet by mouth every 6 (six) hours as needed for moderate pain.   pramipexole 1 MG tablet  Commonly known as: MIRAPEX Take 1 mg by mouth at bedtime.   traMADol 50 MG tablet Commonly known as: Ultram Take 1 tablet (50 mg total) by mouth 2 (two) times daily.        Allergies: No Known Allergies  Family History: Family History  Problem Relation Age of Onset   Asthma Mother    Colon cancer Brother    Stroke Brother     Social History:  reports that he has never smoked. He has never used smokeless tobacco. He reports that he does not drink alcohol and does not use drugs.   Physical Exam: BP (!) 169/91   Pulse (!) 57   Ht '6\' 5"'$  (1.956 m)   Wt 229 lb (103.9 kg)   BMI  27.16 kg/m   Constitutional:  Alert and oriented, No acute distress. HEENT: [place] AT Respiratory: Normal respiratory effort, no increased work of breathing. Psychiatric: Normal mood and affect.   Assessment & Plan:    1. Elevated PSA Most recent PSA improved from the prior elevation of 11 We discussed a PI-RADS 3 lesion is considered indeterminate and could represent benign prostate tissue or cancer Fusion biopsy was discussed PSA density is 0.08 The option of continued surveillance was discussed. Last PSA was in April 2023.  He has initially elected surveillance and will repeat the PSA in October 2023.   Abbie Sons, [place] [address] [place],  [address] [phone]

## 2021-11-01 ENCOUNTER — Other Ambulatory Visit: Payer: Medicare Other

## 2021-11-01 DIAGNOSIS — R972 Elevated prostate specific antigen [PSA]: Secondary | ICD-10-CM

## 2021-11-02 ENCOUNTER — Encounter: Payer: Self-pay | Admitting: *Deleted

## 2021-11-02 LAB — PSA: Prostate Specific Ag, Serum: 5.6 ng/mL — ABNORMAL HIGH (ref 0.0–4.0)

## 2022-05-04 ENCOUNTER — Ambulatory Visit: Payer: Medicare Other | Admitting: Urology

## 2022-05-04 ENCOUNTER — Other Ambulatory Visit: Payer: Self-pay | Admitting: *Deleted

## 2022-05-04 ENCOUNTER — Other Ambulatory Visit: Payer: Medicare Other

## 2022-05-04 DIAGNOSIS — R972 Elevated prostate specific antigen [PSA]: Secondary | ICD-10-CM

## 2022-05-05 ENCOUNTER — Other Ambulatory Visit: Payer: Medicare Other

## 2022-05-05 DIAGNOSIS — R972 Elevated prostate specific antigen [PSA]: Secondary | ICD-10-CM

## 2022-05-06 LAB — PSA: Prostate Specific Ag, Serum: 5.1 ng/mL — ABNORMAL HIGH (ref 0.0–4.0)

## 2022-05-08 ENCOUNTER — Ambulatory Visit (INDEPENDENT_AMBULATORY_CARE_PROVIDER_SITE_OTHER): Payer: Medicare Other | Admitting: Urology

## 2022-05-08 ENCOUNTER — Encounter: Payer: Self-pay | Admitting: Urology

## 2022-05-08 VITALS — BP 108/71 | HR 61 | Ht 77.0 in | Wt 225.0 lb

## 2022-05-08 DIAGNOSIS — R972 Elevated prostate specific antigen [PSA]: Secondary | ICD-10-CM

## 2022-05-08 NOTE — Progress Notes (Signed)
I, DeAsia L Maxie,acting as a scribe for Riki Altes, MD.,have documented all relevant documentation on the behalf of Riki Altes, MD,as directed by  Riki Altes, MD while in the presence of Riki Altes, MD.   Albin Fischer Abdulla,acting as a scribe for Riki Altes, MD.,have documented all relevant documentation on the behalf of Riki Altes, MD,as directed by  Riki Altes, MD while in the presence of Riki Altes, MD.   05/08/2022 11:50 AM   Dennis Lawrence Sep 07, 1950 [id number]  Referring provider: Dorothey Baseman, MD [address] [place],  [place] [address]  Chief Complaint  Patient presents with   Elevated PSA   Urologic History: Elevated PSA -PSA 03/11/2021 elevated at 6.01  -Prostate MRI 07/13/2021 with a 66 g gland and PI-RADS 3 lesion left anterior transition zone. There were findings consistent with prior prostatic inflammation   HPI: 72 y.o. male who presents for 6 month follow-up.  Doing well since last visit No bothersome LUTS Denies dysuria, gross hematuria Denies flank, abdominal or pelvic pain  PSA in 12/2022 stable at 5.1  PSA trend  Prostate Specific Ag, Serum  Latest Ref Rng 0.0 - 4.0 ng/mL  04/28/2021 5.8 (H)   11/01/2021 5.6 (H)   05/05/2022 5.1 (H)      PMH: Past Medical History:  Diagnosis Date   Arthritis    HANDS   Atypical nevus 09/14/2014   left lower back-mild   Back pain    CHRONIC, DEGENERATION OF LUMBAR OR LUMBOSACRAL INTERVERTEBRAL DISC   H/O seasonal allergies    History of positive PPD    NORMAL CHEST XRAY 09/1998   Hyperlipidemia    Hypertension    Neuromuscular disorder    NEURALGIA AND NEURITIS   Restless leg syndrome    Tendon tear    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, RIGHT ANKLE    Surgical History: Past Surgical History:  Procedure Laterality Date   ANKLE ARTHROSCOPY Right 11/11/2015   Procedure: ANKLE ARTHROSCOPY/OCD REPAIR;  Surgeon: Gwyneth Revels, DPM;  Location: [facility];  Service: Podiatry;  Laterality: Right;  POPLITEAL   ANTERIOR TALOFIBULAR LIGAMENT REPAIR Right 11/11/2015   Procedure: ANKLE BROSTROM-GOULD PROCEDURE;  Surgeon: Gwyneth Revels, DPM;  Location: [facility];  Service: Podiatry;  Laterality: Right;   BACK SURGERY     L5-S1 DISCECTOMY, NUMBNESS LEFT BIG TOE   COLONOSCOPY     FUNCTIONAL ENDOSCOPIC SINUS SURGERY     TENDON REPAIR Right 11/11/2015   Procedure: FLEXOR TENDON REPAIR-SECOND, TENOLYSIS-SINGLE;  Surgeon: Gwyneth Revels, DPM;  Location: [facility];  Service: Podiatry;  Laterality: Right;    Home Medications:  Allergies as of 05/08/2022   No Known Allergies      Medication List        Accurate as of May 08, 2022 11:50 AM. If you have any questions, ask your nurse or doctor.          STOP taking these medications    oxyCODONE-acetaminophen 5-325 MG tablet Commonly known as: Roxicet Stopped by: Riki Altes, MD   pramipexole 1 MG tablet Commonly known as: MIRAPEX Stopped by: Riki Altes, MD       TAKE these medications    acetaminophen 500 MG tablet Commonly known as: TYLENOL Take 1,000 mg by mouth every 6 (six) hours as needed.   ascorbic acid 1000 MG tablet Commonly known as: VITAMIN C Take 1,000 mg by mouth daily.  celecoxib 200 MG capsule Commonly known as: CELEBREX Take 200 mg by mouth 2 (two) times daily. AM AND PM   cyclobenzaprine 10 MG tablet Commonly known as: FLEXERIL Take 10 mg by mouth 3 (three) times daily as needed for muscle spasms.   Glucosamine-Chondroitin 250-200 MG Caps Take by mouth.   multivitamin tablet Take 1 tablet by mouth daily.   traMADol 50 MG tablet Commonly known as: Ultram Take 1 tablet (50 mg total) by mouth 2 (two) times daily.        Allergies: No Known Allergies  Family History: Family History  Problem Relation Age of Onset   Asthma Mother    Colon cancer Brother    Stroke Brother     Social History:  reports  that he has never smoked. He has never used smokeless tobacco. He reports that he does not drink alcohol and does not use drugs.   Physical Exam: BP 108/71   Pulse 61   Ht 6\' 5"  (1.956 m)   Wt 225 lb (102.1 kg)   BMI 26.68 kg/m   Constitutional:  Alert and oriented, No acute distress. HEENT: [place] AT Respiratory: Normal respiratory effort, no increased work of breathing. Psychiatric: Normal mood and affect.  Assessment & Plan:    1. Elevated PSA PSA decreasing. He would like to continue surveillance. Follow up in one year PSA/DRE.  I have reviewed the above documentation for accuracy and completeness, and I agree with the above.   Riki Altes, MD  [facility] [address], [address] [place], [place] [address] [phone]

## 2022-12-11 ENCOUNTER — Other Ambulatory Visit: Payer: Self-pay | Admitting: Family Medicine

## 2022-12-11 DIAGNOSIS — G8929 Other chronic pain: Secondary | ICD-10-CM

## 2022-12-12 ENCOUNTER — Ambulatory Visit
Admission: RE | Admit: 2022-12-12 | Discharge: 2022-12-12 | Disposition: A | Discharge status: Home / Self Care | Payer: Medicare Other | Source: Ambulatory Visit | Attending: Family Medicine | Admitting: Family Medicine

## 2022-12-12 DIAGNOSIS — G8929 Other chronic pain: Secondary | ICD-10-CM | POA: Insufficient documentation

## 2022-12-12 DIAGNOSIS — R519 Headache, unspecified: Secondary | ICD-10-CM | POA: Insufficient documentation

## 2022-12-12 MED ORDER — GADOBUTROL 1 MMOL/ML IV SOLN: 10.0000 mL | Freq: Once | INTRAVENOUS | Status: DC | PRN

## 2023-01-31 ENCOUNTER — Other Ambulatory Visit: Payer: Self-pay | Admitting: Orthopedic Surgery

## 2023-01-31 DIAGNOSIS — M25462 Effusion, left knee: Secondary | ICD-10-CM

## 2023-01-31 DIAGNOSIS — M1712 Unilateral primary osteoarthritis, left knee: Secondary | ICD-10-CM

## 2023-01-31 DIAGNOSIS — M2392 Unspecified internal derangement of left knee: Secondary | ICD-10-CM

## 2023-02-03 ENCOUNTER — Ambulatory Visit
Admission: RE | Admit: 2023-02-03 | Discharge: 2023-02-03 | Disposition: A | Discharge status: Home / Self Care | Payer: Medicare Other | Source: Ambulatory Visit | Attending: Orthopedic Surgery | Admitting: Orthopedic Surgery

## 2023-02-03 DIAGNOSIS — M1712 Unilateral primary osteoarthritis, left knee: Secondary | ICD-10-CM

## 2023-02-03 DIAGNOSIS — M25462 Effusion, left knee: Secondary | ICD-10-CM

## 2023-02-03 DIAGNOSIS — M2392 Unspecified internal derangement of left knee: Secondary | ICD-10-CM

## 2023-05-08 ENCOUNTER — Other Ambulatory Visit: Payer: Self-pay

## 2023-05-08 ENCOUNTER — Other Ambulatory Visit

## 2023-05-08 DIAGNOSIS — R972 Elevated prostate specific antigen [PSA]: Secondary | ICD-10-CM

## 2023-05-09 LAB — PSA: Prostate Specific Ag, Serum: 5.4 ng/mL — ABNORMAL HIGH (ref 0.0–4.0)

## 2023-05-10 ENCOUNTER — Ambulatory Visit (INDEPENDENT_AMBULATORY_CARE_PROVIDER_SITE_OTHER): Payer: Self-pay | Admitting: Urology

## 2023-05-10 VITALS — BP 120/74 | HR 68 | Ht 77.0 in | Wt 225.0 lb

## 2023-05-10 DIAGNOSIS — R972 Elevated prostate specific antigen [PSA]: Secondary | ICD-10-CM

## 2023-05-10 NOTE — Progress Notes (Signed)
 I, Maysun Jamey Mccallum, acting as a scribe for Geraline Knapp, MD., have documented all relevant documentation on the behalf of Geraline Knapp, MD, as directed by Geraline Knapp, MD while in the presence of Geraline Knapp, MD.  05/10/2023 11:07 PM   Dennis Lawrence [birth year] [id number]  Referring provider: Rory Collard, MD [phone] [place],  [place] [address]  Chief Complaint  Patient presents with   Elevated PSA   Urologic History: Elevated PSA PSA 03/11/2021 elevated at 6.01  Prostate MRI 07/13/2021 with a 66 g gland and PI-RADS 3 lesion left anterior transition zone. There were findings consistent with prior prostatic inflammation   HPI: Dennis Lawrence is a 73 y.o. male presents for annual follow-up.  Doing well since last visit No bothersome LUTS Denies dysuria, gross hematuria Denies flank, abdominal or pelvic pain PSA 05/08/23 5.4   PSA trend  Prostate Specific Ag, Serum  Latest Ref Rng 0.0 - 4.0 ng/mL  04/28/2021 5.8 (H)   11/01/2021 5.6 (H)   05/05/2022 5.1 (H)   05/08/2023 5.4 (H)      PMH: Past Medical History:  Diagnosis Date   Arthritis    HANDS   Atypical nevus 09/14/2014   left lower back-mild   Back pain    CHRONIC, DEGENERATION OF LUMBAR OR LUMBOSACRAL INTERVERTEBRAL DISC   H/O seasonal allergies    History of positive PPD    NORMAL CHEST XRAY 09/1998   Hyperlipidemia    Hypertension    Neuromuscular disorder ([facility])    NEURALGIA AND NEURITIS   Restless leg syndrome    Tendon tear    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, RIGHT ANKLE    Surgical History: Past Surgical History:  Procedure Laterality Date   ANKLE ARTHROSCOPY Right 11/11/2015   Procedure: ANKLE ARTHROSCOPY/OCD REPAIR;  Surgeon: Anell Baptist, DPM;  Location: [facility];  Service: Podiatry;  Laterality: Right;  POPLITEAL   ANTERIOR TALOFIBULAR LIGAMENT REPAIR Right 11/11/2015   Procedure: ANKLE BROSTROM-GOULD PROCEDURE;  Surgeon: Anell Baptist, DPM;   Location: [facility];  Service: Podiatry;  Laterality: Right;   BACK SURGERY     L5-S1 DISCECTOMY, NUMBNESS LEFT BIG TOE   COLONOSCOPY     FUNCTIONAL ENDOSCOPIC SINUS SURGERY     TENDON REPAIR Right 11/11/2015   Procedure: FLEXOR TENDON REPAIR-SECOND, TENOLYSIS-SINGLE;  Surgeon: Anell Baptist, DPM;  Location: [facility];  Service: Podiatry;  Laterality: Right;    Home Medications:  Allergies as of 05/10/2023   No Known Allergies      Medication List        Accurate as of May 10, 2023 11:07 PM. If you have any questions, ask your nurse or doctor.          STOP taking these medications    traMADol  50 MG tablet Commonly known as: Ultram        TAKE these medications    acetaminophen  500 MG tablet Commonly known as: TYLENOL  Take 1,000 mg by mouth every 6 (six) hours as needed.   ascorbic acid 1000 MG tablet Commonly known as: VITAMIN C Take 1,000 mg by mouth daily.   celecoxib 200 MG capsule Commonly known as: CELEBREX Take 200 mg by mouth 2 (two) times daily. AM AND PM   cyclobenzaprine 10 MG tablet Commonly known as: FLEXERIL Take 10 mg by mouth 3 (three) times daily as needed for muscle spasms.   Glucosamine-Chondroitin 250-200 MG Caps Take by mouth.  multivitamin tablet Take 1 tablet by mouth daily.        Allergies: No Known Allergies  Family History: Family History  Problem Relation Age of Onset   Asthma Mother    Colon cancer Brother    Stroke Brother     Social History:  reports that he has never smoked. He has never used smokeless tobacco. He reports that he does not drink alcohol and does not use drugs.   Physical Exam: BP 120/74   Pulse 68   Ht 6\' 5"  (1.956 m)   Wt 225 lb (102.1 kg)   BMI 26.68 kg/m   Constitutional:  Alert and oriented, No acute distress. HEENT: [place] AT Respiratory: Normal respiratory effort, no increased work of breathing. GU: Declined DRE. Psychiatric: Normal mood and  affect.   Assessment & Plan:    1. Elevated PSA Stable PSA He desires to continue active surveillance Follow-up 1 year with PSA/DRE.  I have reviewed the above documentation for accuracy and completeness, and I agree with the above.   Geraline Knapp, MD  [facility] [address] [place], [place] [address] [phone]

## 2023-05-11 ENCOUNTER — Ambulatory Visit: Payer: Medicare Other | Admitting: Urology

## 2023-05-14 ENCOUNTER — Encounter: Payer: Self-pay | Admitting: Urology

## 2023-11-15 ENCOUNTER — Other Ambulatory Visit: Payer: Self-pay

## 2023-11-15 DIAGNOSIS — G939 Disorder of brain, unspecified: Secondary | ICD-10-CM

## 2023-11-15 DIAGNOSIS — R930 Abnormal findings on diagnostic imaging of skull and head, not elsewhere classified: Secondary | ICD-10-CM

## 2023-11-20 ENCOUNTER — Ambulatory Visit
Admission: RE | Admit: 2023-11-20 | Discharge: 2023-11-20 | Disposition: A | Discharge status: Home / Self Care | Source: Ambulatory Visit

## 2023-11-20 DIAGNOSIS — R930 Abnormal findings on diagnostic imaging of skull and head, not elsewhere classified: Secondary | ICD-10-CM | POA: Diagnosis present

## 2023-11-20 DIAGNOSIS — G939 Disorder of brain, unspecified: Secondary | ICD-10-CM | POA: Insufficient documentation

## 2024-05-09 ENCOUNTER — Other Ambulatory Visit

## 2024-05-14 ENCOUNTER — Ambulatory Visit: Admitting: Urology

## 2024-05-16 ENCOUNTER — Ambulatory Visit: Admitting: Urology
